# Patient Record
Sex: Female | Born: 1970 | Race: White | Hispanic: No | Smoking: Former smoker
Health system: Southern US, Community
[De-identification: ages and names within clinical notes are randomized; demographics above are authoritative.]

## PROBLEM LIST (undated history)

## (undated) DIAGNOSIS — J449 Chronic obstructive pulmonary disease, unspecified: Secondary | ICD-10-CM

## (undated) DIAGNOSIS — J439 Emphysema, unspecified: Secondary | ICD-10-CM

## (undated) HISTORY — PX: BLADDER SURGERY: SHX569

## (undated) HISTORY — PX: ABDOMINAL HYSTERECTOMY: SHX81

## (undated) HISTORY — PX: CHOLECYSTECTOMY: SHX55

---

## 2009-03-14 ENCOUNTER — Ambulatory Visit: Payer: Self-pay | Admitting: Internal Medicine

## 2012-07-29 ENCOUNTER — Ambulatory Visit: Payer: Self-pay

## 2012-08-17 ENCOUNTER — Emergency Department: Payer: Self-pay | Admitting: Emergency Medicine

## 2012-08-22 ENCOUNTER — Ambulatory Visit: Payer: Self-pay | Admitting: Unknown Physician Specialty

## 2012-12-16 ENCOUNTER — Ambulatory Visit: Payer: Self-pay | Admitting: Family Medicine

## 2012-12-16 LAB — URINALYSIS, COMPLETE
Bilirubin,UR: NEGATIVE
Glucose,UR: NEGATIVE mg/dL (ref 0–75)
Ph: 6 (ref 4.5–8.0)
Specific Gravity: 1.02 (ref 1.003–1.030)

## 2012-12-18 LAB — URINE CULTURE

## 2013-01-01 DIAGNOSIS — Z720 Tobacco use: Secondary | ICD-10-CM | POA: Insufficient documentation

## 2013-01-08 DIAGNOSIS — N8111 Cystocele, midline: Secondary | ICD-10-CM | POA: Insufficient documentation

## 2013-02-07 ENCOUNTER — Ambulatory Visit: Payer: Self-pay | Admitting: Family Medicine

## 2013-05-28 ENCOUNTER — Ambulatory Visit: Payer: Self-pay | Admitting: Family Medicine

## 2013-09-03 ENCOUNTER — Ambulatory Visit: Payer: Self-pay | Admitting: Anesthesiology

## 2013-09-18 ENCOUNTER — Ambulatory Visit: Payer: Self-pay | Admitting: Anesthesiology

## 2014-05-20 DIAGNOSIS — J449 Chronic obstructive pulmonary disease, unspecified: Secondary | ICD-10-CM | POA: Insufficient documentation

## 2014-05-29 ENCOUNTER — Ambulatory Visit: Payer: Self-pay | Admitting: Family Medicine

## 2014-07-04 ENCOUNTER — Ambulatory Visit: Payer: Self-pay | Admitting: Family Medicine

## 2014-08-09 NOTE — H&P (Signed)
PATIENT NAME:  Phyllis Edwards, [name] MR#:  [id number] DATE OF BIRTH:  01-Dec-1970  DATE OF ADMISSION:  09/03/2013  CHIEF COMPLAINT: Low back pain with radiation of the right posterior lateral leg.   PROCEDURE: None.   HISTORY OF PRESENT ILLNESS: Ms. Phyllis Edwards is a 44 year old female with a long-standing history of lower back pain with radiation into the right posterior lateral leg for the past 14 or 15 months.   She reportedly fell off a deck and now has a pain that is rated at a VAS of 10 maximum, 6 at best. Influenced by time of day, worse in the morning and worse at night, but any type of mobility seems to aggravate the pain with associated radiation down into the right posterolateral leg. She has pain that wakes her up at night, does not let her sleep, with some swelling she reports and associated numbness affecting the base of the right foot and occasionally the top of the right foot. The pain is described as sharp, shooting, stabbing, tender, and throbbing. She had a previous epidural steroid injection by DrMarland Kitchen..Marland Kitchen.Marland Kitchen.Marland Kitchen. without  getting any significant improvement and discontinued care with him. An MRI is available for the patient dated 08/22/2012 showing evidence of a right paracentral disk protrusion at L4-L5 with mild flattening of the right lateral recess and a small central disk protrusion at L5-S1.   PAST MEDICAL HISTORY: Significant for hysterectomy, cholecystectomy, bilateral tubal ligation, but otherwise she reports no other medical conditions.   REVIEW OF SYSTEMS:   CARDIAC: Negative. PULMONARY: Negative.  NEUROLOGIC: Negative.  MEDICATIONS: Diclofenac, cyclobenzaprine, and multivitamin.   ALLERGIES: FLAGYL WHICH YIELDS A RASH.   PHYSICAL EXAMINATION: VITAL SIGNS:  Reveal a blood pressure of 132/87, the VAS 6/10, temperature 98.8 with a heart rate of 92.  HEENT: Pupils equally round and reactive to light. Extraocular muscles intact.  HEART: Regular rate and rhythm.  LUNGS: Clear to  auscultation.  EXTREMITIES: Inspection of the low back reveals some pain with extension and left lateral rotation and right lateral rotation. She has a positive straight leg raise at approximately 45 degrees on the right side, negative on the left, and muscle tone and bulk seem to be good with intact strength.   ASSESSMENT:  1. L4-L5, L5-S1 degenerative disk disease with radicular symptoms in the L5 distribution, right side.  2. Lumbar facet syndrome.  3. Myofascial low back pain.   PLAN: I had a long discussion with the patient regarding her care and I would like to identify the level that Dr. Yves Dillhasnis  did perform for her epidural steroid injection. I think it would be reasonable to proceed with a repeat epidural injection to see if she gains some relief with changing this up slightly. Also, she may be a candidate for a facet injection. I will have her continue with her physical therapy regimen with efforts at stretching exercises described to her today and continue current medication management with return to clinic at next available date for.    ____________________________ Currie ParisJames G. Pernell DupreAdams, MD jga:dd D: 09/04/2013 16:25:22 ET T: 09/04/2013 17:57:24 ET JOB#: [id number]  cc: Currie ParisJames G. Pernell DupreAdams, MD, <Dictator> Courtney ParisElizabeth B. White, FNP Unknown CC   Phyllis EdwardsJAMES G ADAMS MD ELECTRONICALLY SIGNED 09/04/2013 20:31

## 2014-09-23 IMAGING — CR SACRUM AND COCCYX - 2+ VIEW
1 series · 4 of 4 positions shown · non-contrast
Comparison: none

REASON FOR EXAM: fall w/multiple contiusions
COMMENTS:

PROCEDURE:     MDR - MDR SACRUM AND COCCYX  - July 29, 2012 [DATE]
RESULT:     Comparison: None.

[Series 1: ap · 0.17mm/px · 4 of 4 slices shown]
[im 1/4]
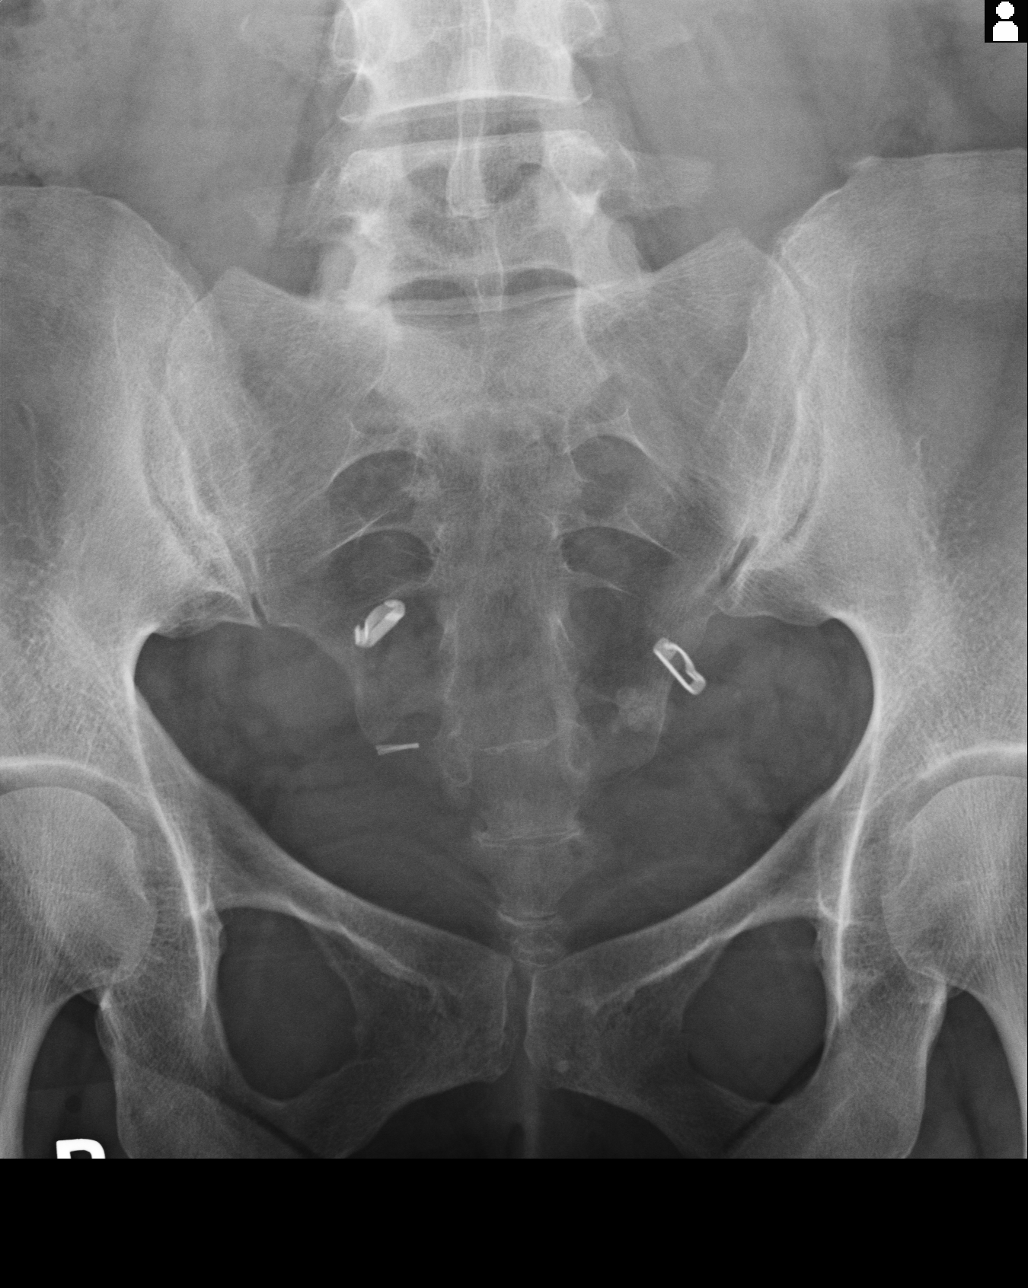
[im 2/4]
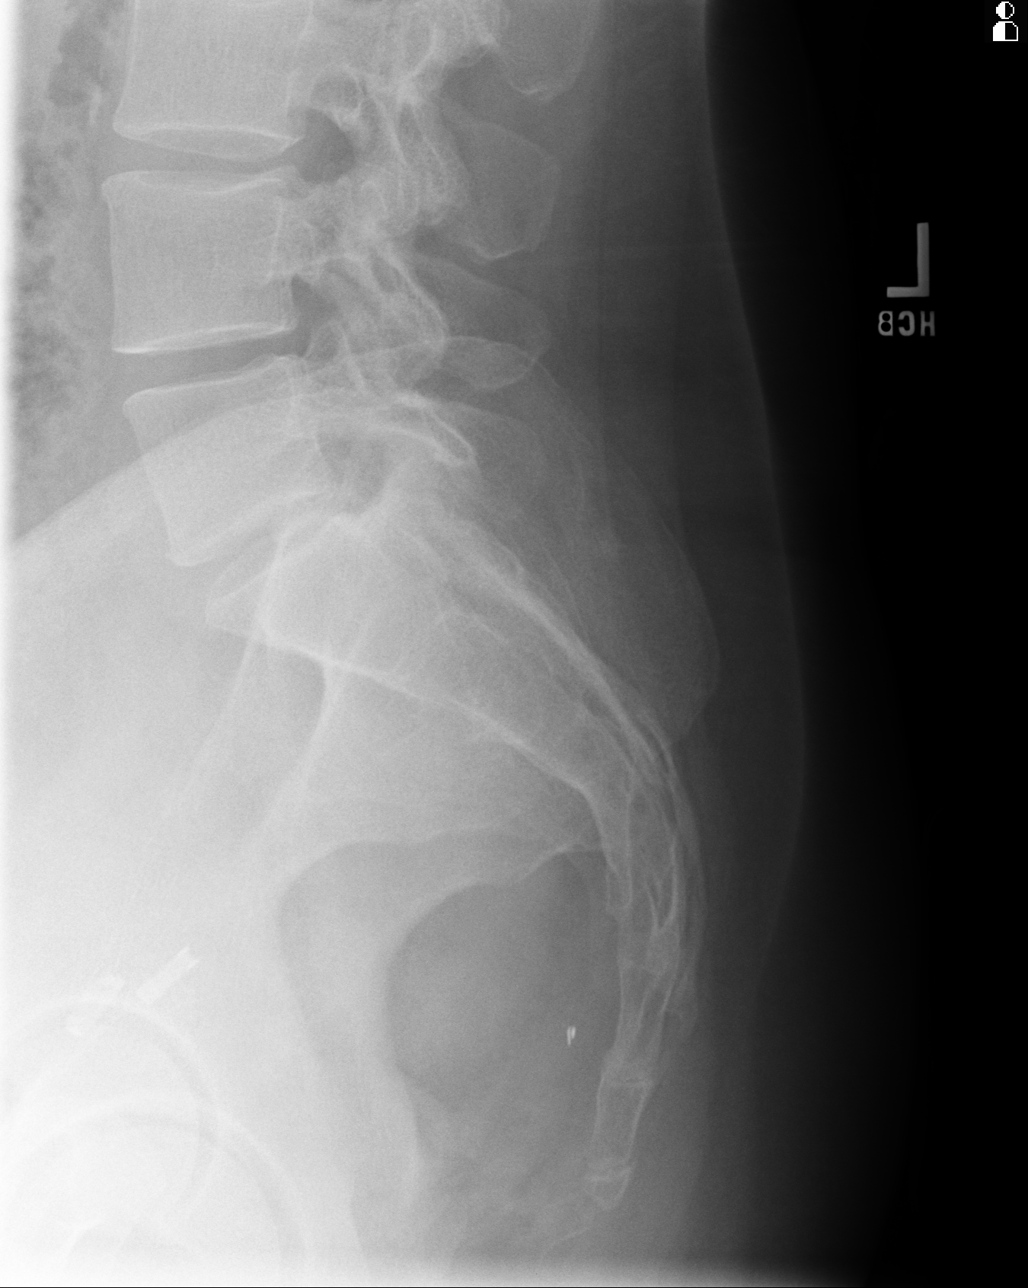
[im 3/4]
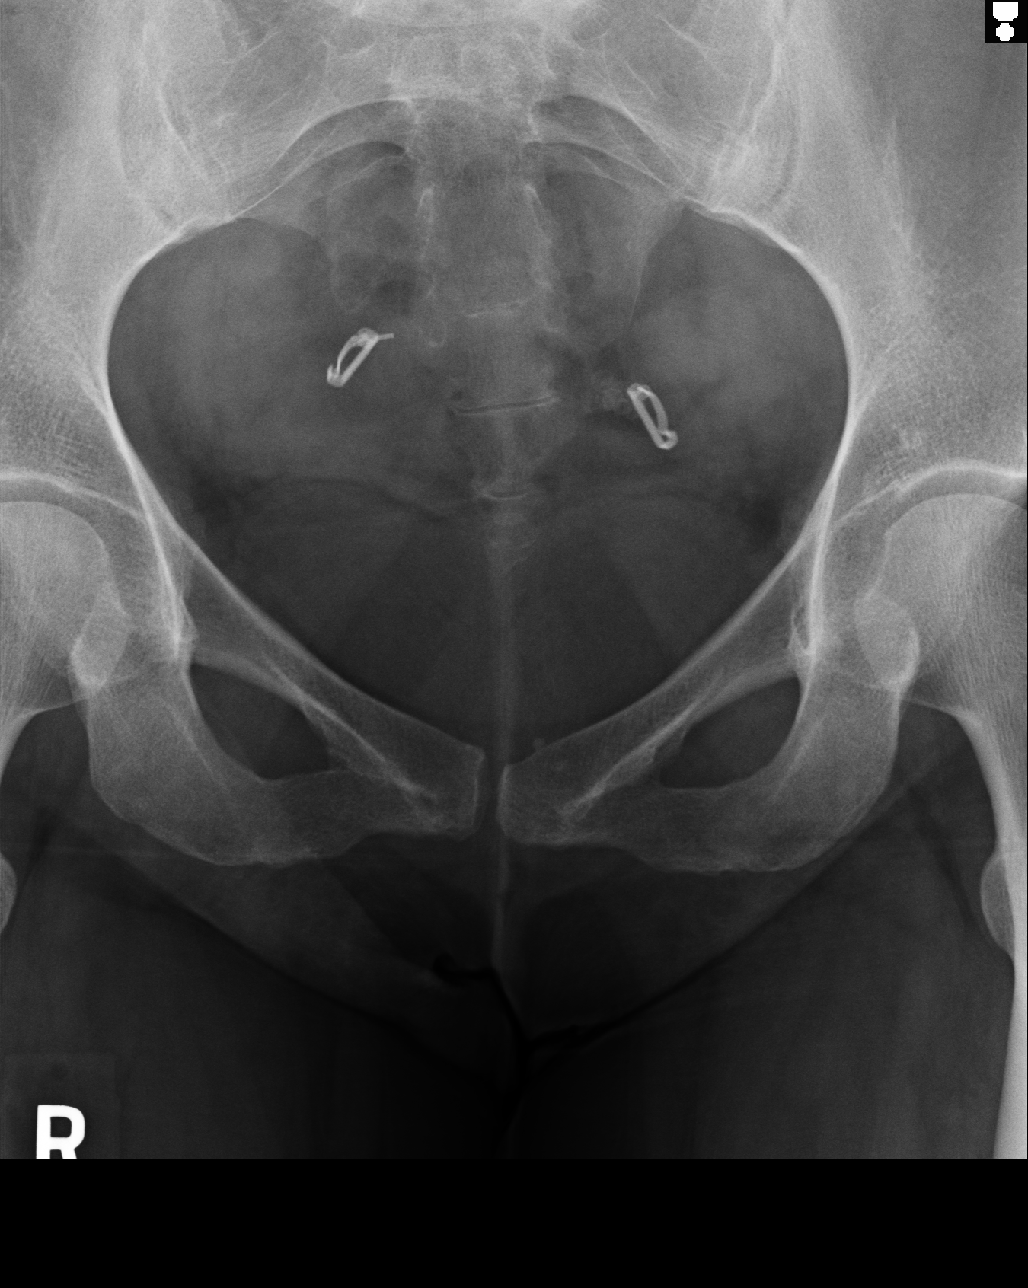
[im 4/4]
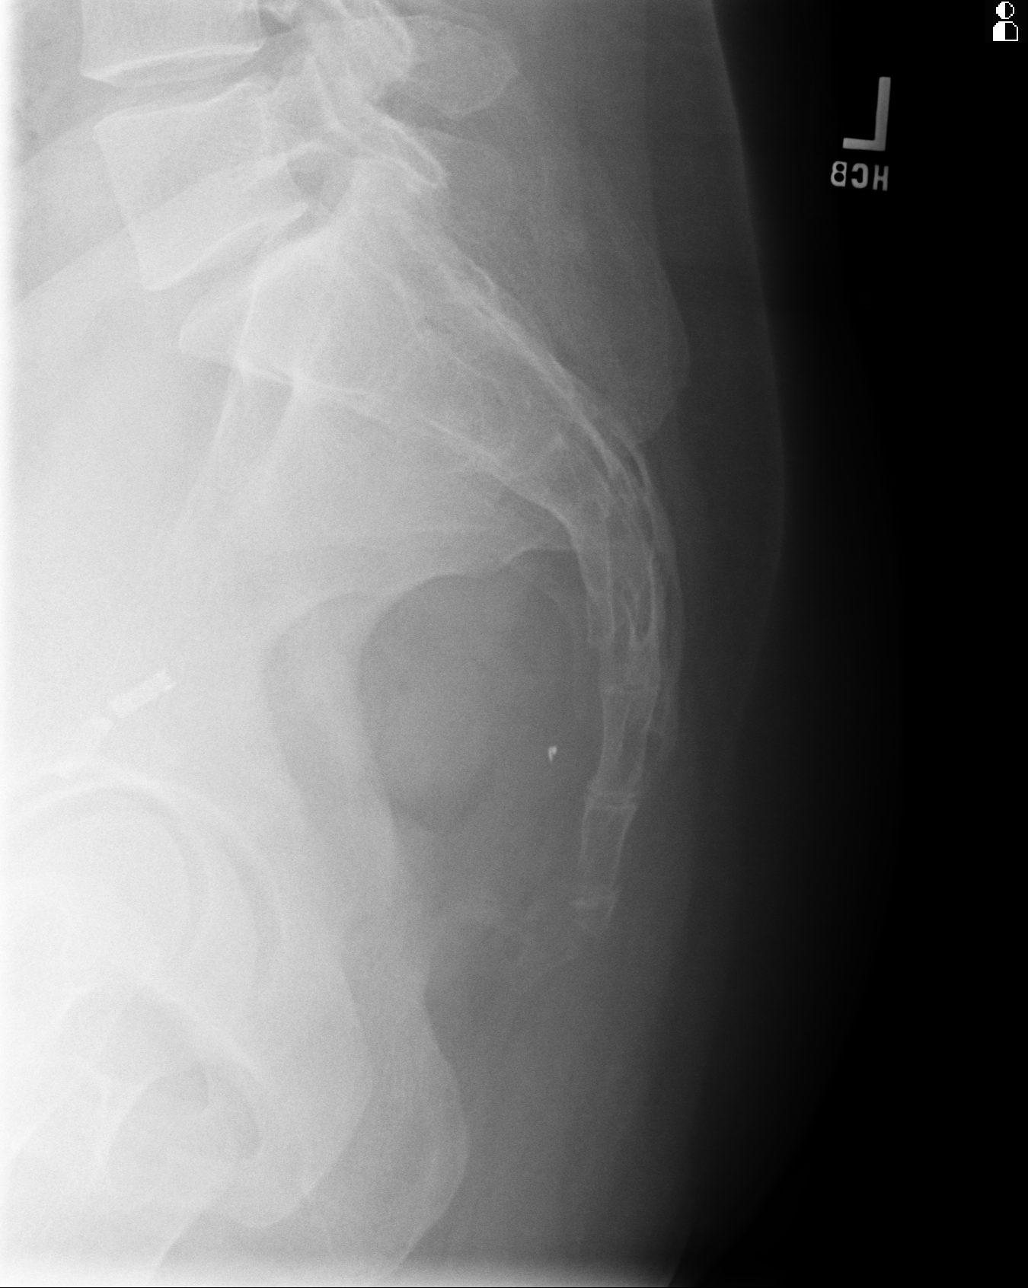

[4 of 4 positions shown; findings below may reference images not displayed]

FINDINGS: No displaced fracture seen.  Tubal ligation clips are present.
IMPRESSION: No displaced fracture seen.

[REDACTED]

## 2014-09-23 IMAGING — CR PELVIS - 1-2 VIEW
1 series · 1 of 1 positions shown · non-contrast
Comparison: none

REASON FOR EXAM: fall w/multiple contusions
COMMENTS:

PROCEDURE:     MDR - MDR PELVIS AP ONLY  - July 29, 2012 [DATE]
RESULT:     Comparison: None.

[ap]
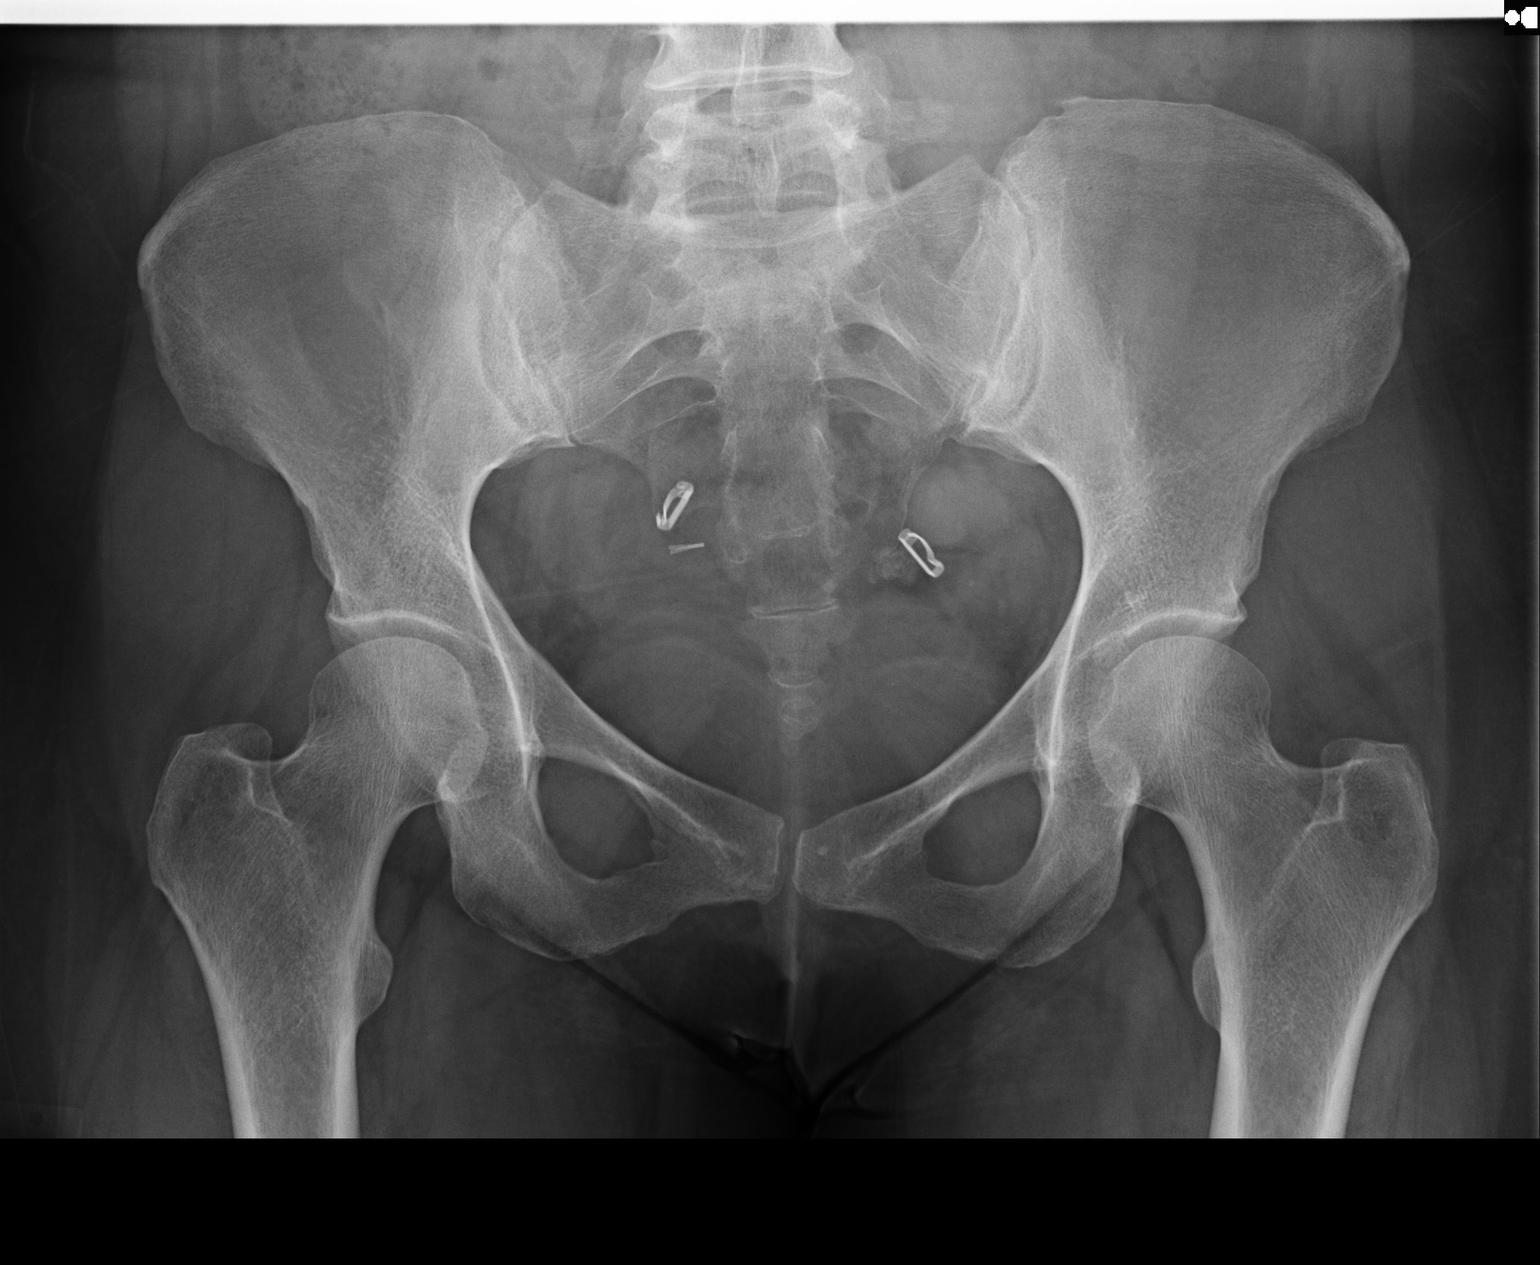

[1 of 1 positions shown; findings below may reference images not displayed]

FINDINGS: No acute fracture. Hip joint spaces are maintained. Tubal ligation clips are
present.
IMPRESSION: No acute fracture.

[REDACTED]

## 2014-09-23 IMAGING — CR CERVICAL SPINE - COMPLETE 4+ VIEW
1 series · 5 of 5 positions shown · non-contrast
Comparison: none

REASON FOR EXAM: fall w/multiple contusions
COMMENTS:

PROCEDURE:     MDR - MDR CERVICAL SPINE COMPLETE  - July 29, 2012 [DATE]
RESULT:     Comparison: None.

[Series 1: lat · 0.17mm/px · 5 of 5 slices shown]
[im 1/5]
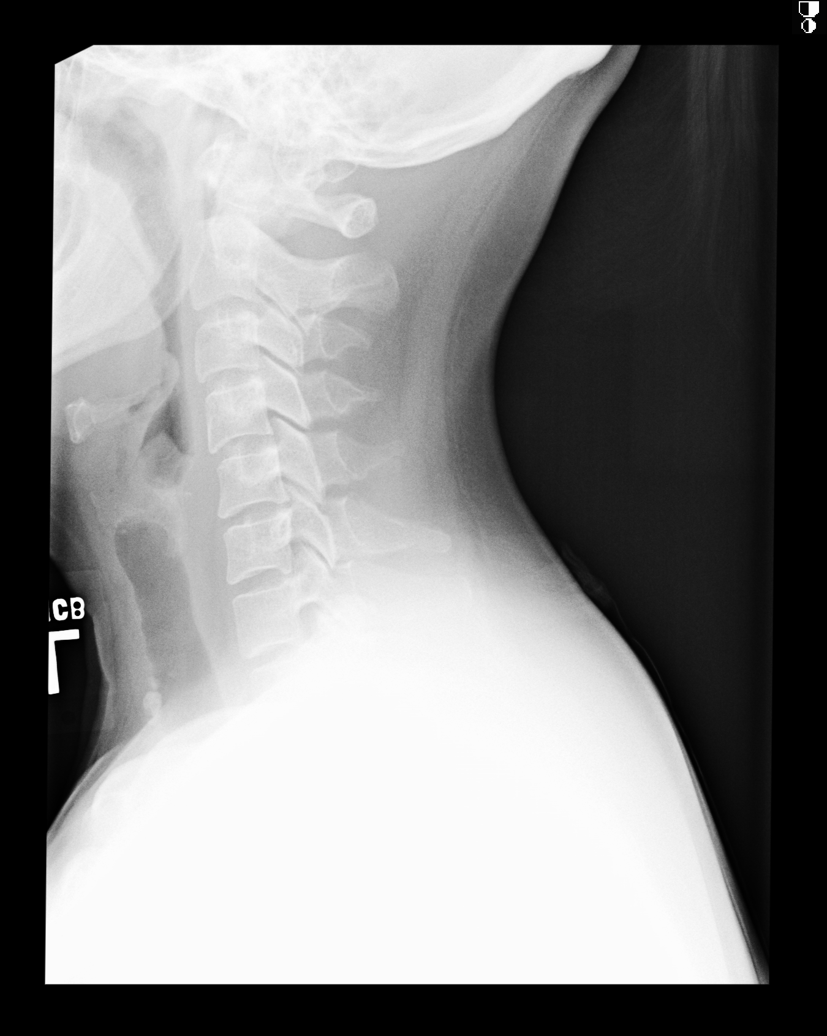
[im 2/5]
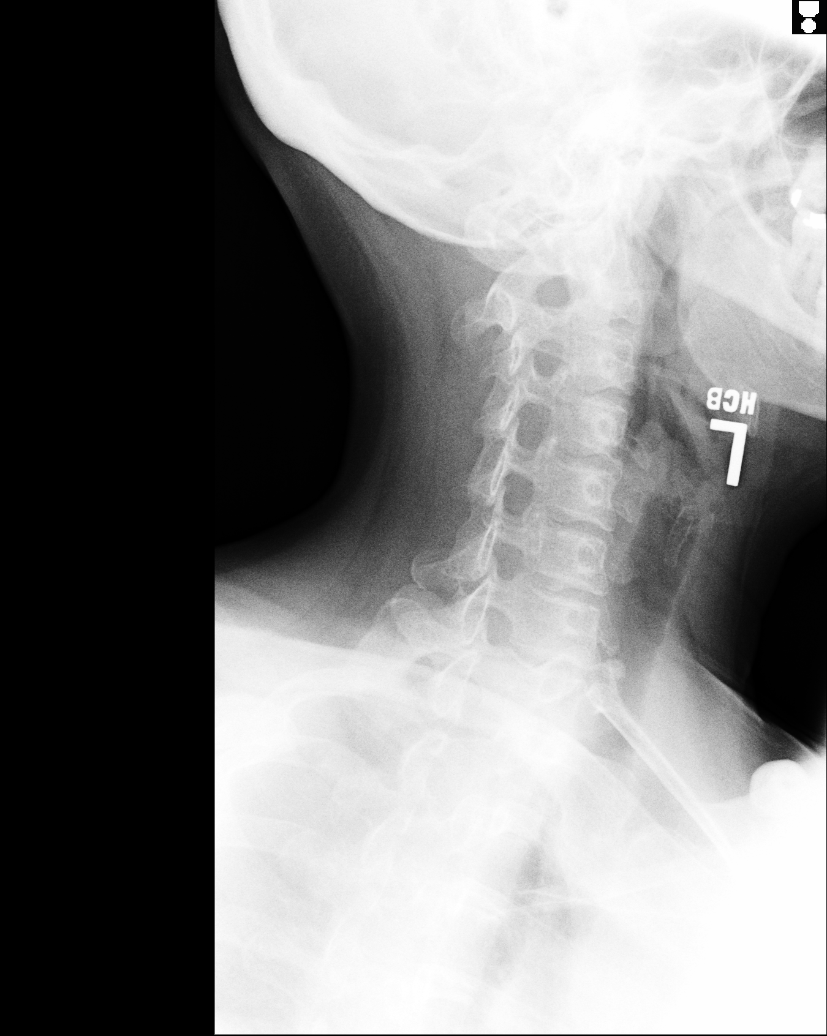
[im 3/5]
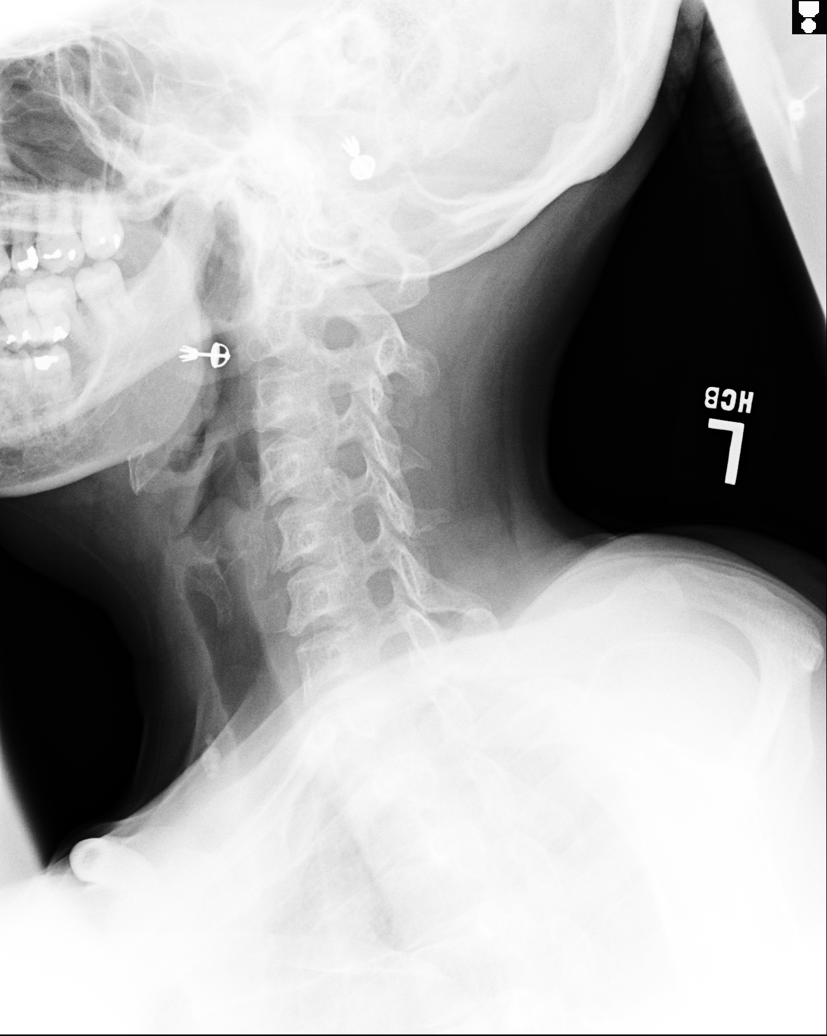
[im 4/5]
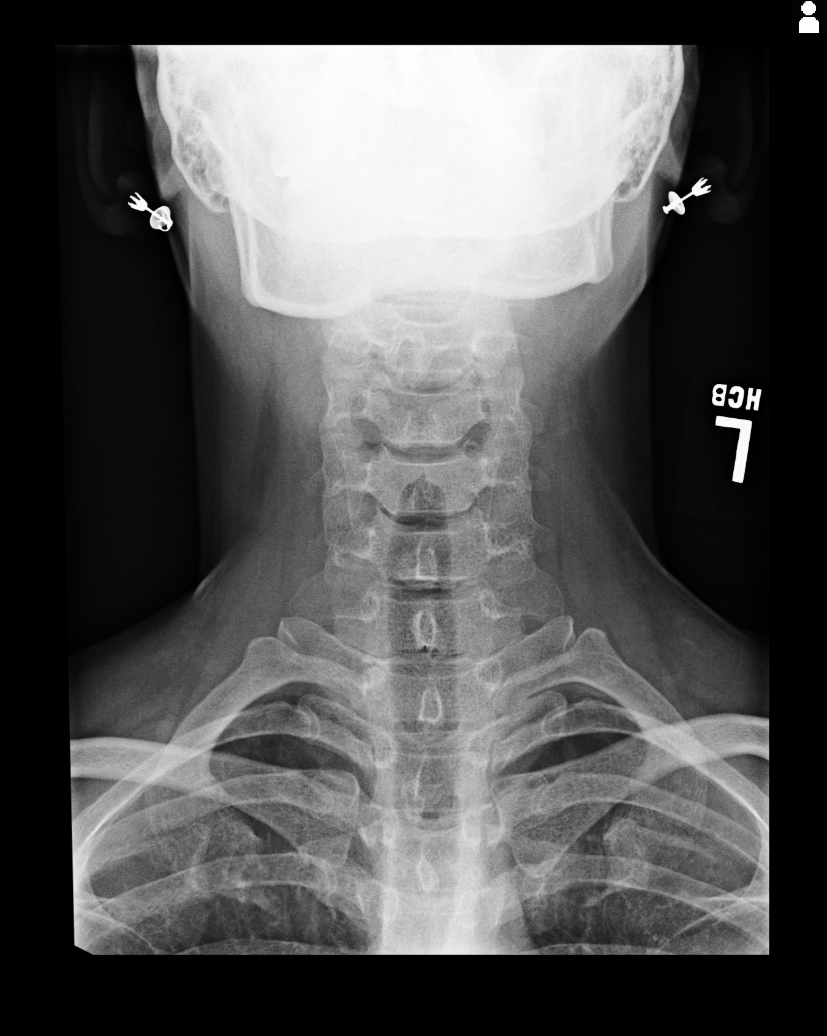
[im 5/5]
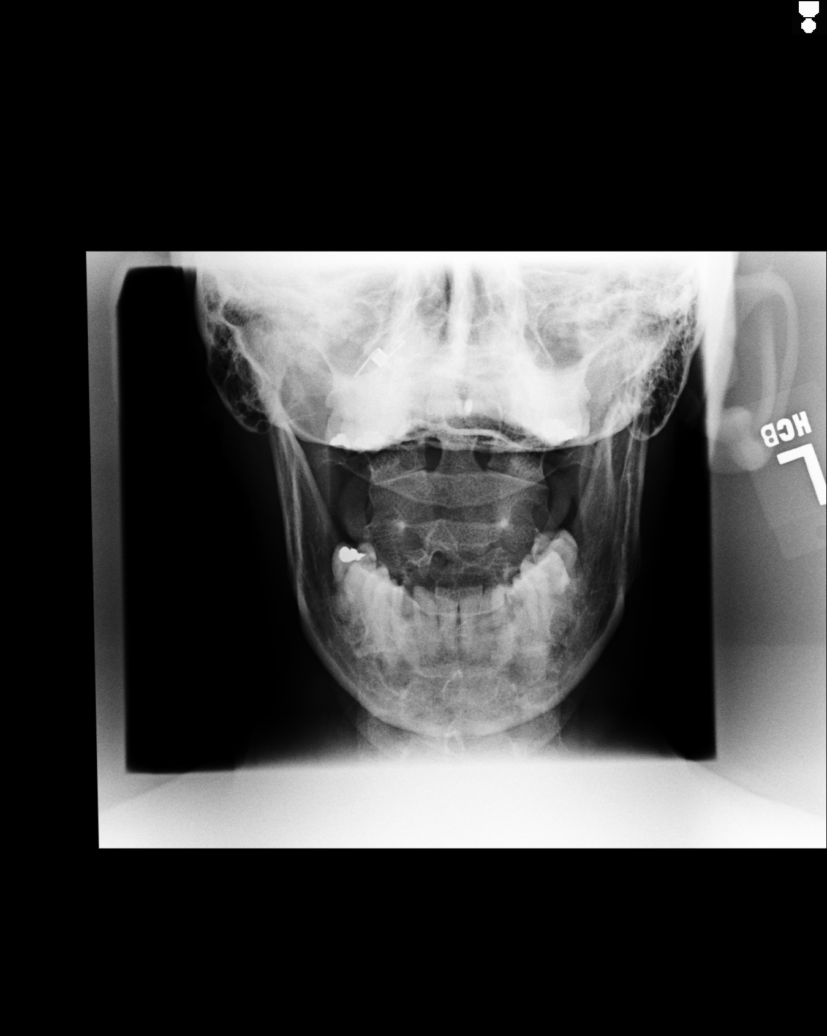

[5 of 5 positions shown; findings below may reference images not displayed]

FINDINGS: The cervical spine is imaged from the skull base through C7. There is slight
reversal of the normal cervical lordosis, which is nonspecific. Vertebral
body heights are maintained. There is normal alignment. Prevertebral soft
tissues are within normal limits.
IMPRESSION: No cervical spine fracture identified. If there is continued clinical
concern for occult cervical spine fracture, further evaluation with CT would
be recommended.

[REDACTED]

## 2014-09-23 IMAGING — CR DG SHOULDER 3+V*R*
1 series · 3 of 3 positions shown · non-contrast
Comparison: none

REASON FOR EXAM: fall w/multiple contusions
COMMENTS:

PROCEDURE:     MDR - MDR SHOULDER RIGHT COMPLETE  - July 29, 2012 [DATE]
RESULT:     Comparison: None.

[Series 1: internal rotate · 0.17mm/px · 3 of 3 slices shown]
[im 1/3]
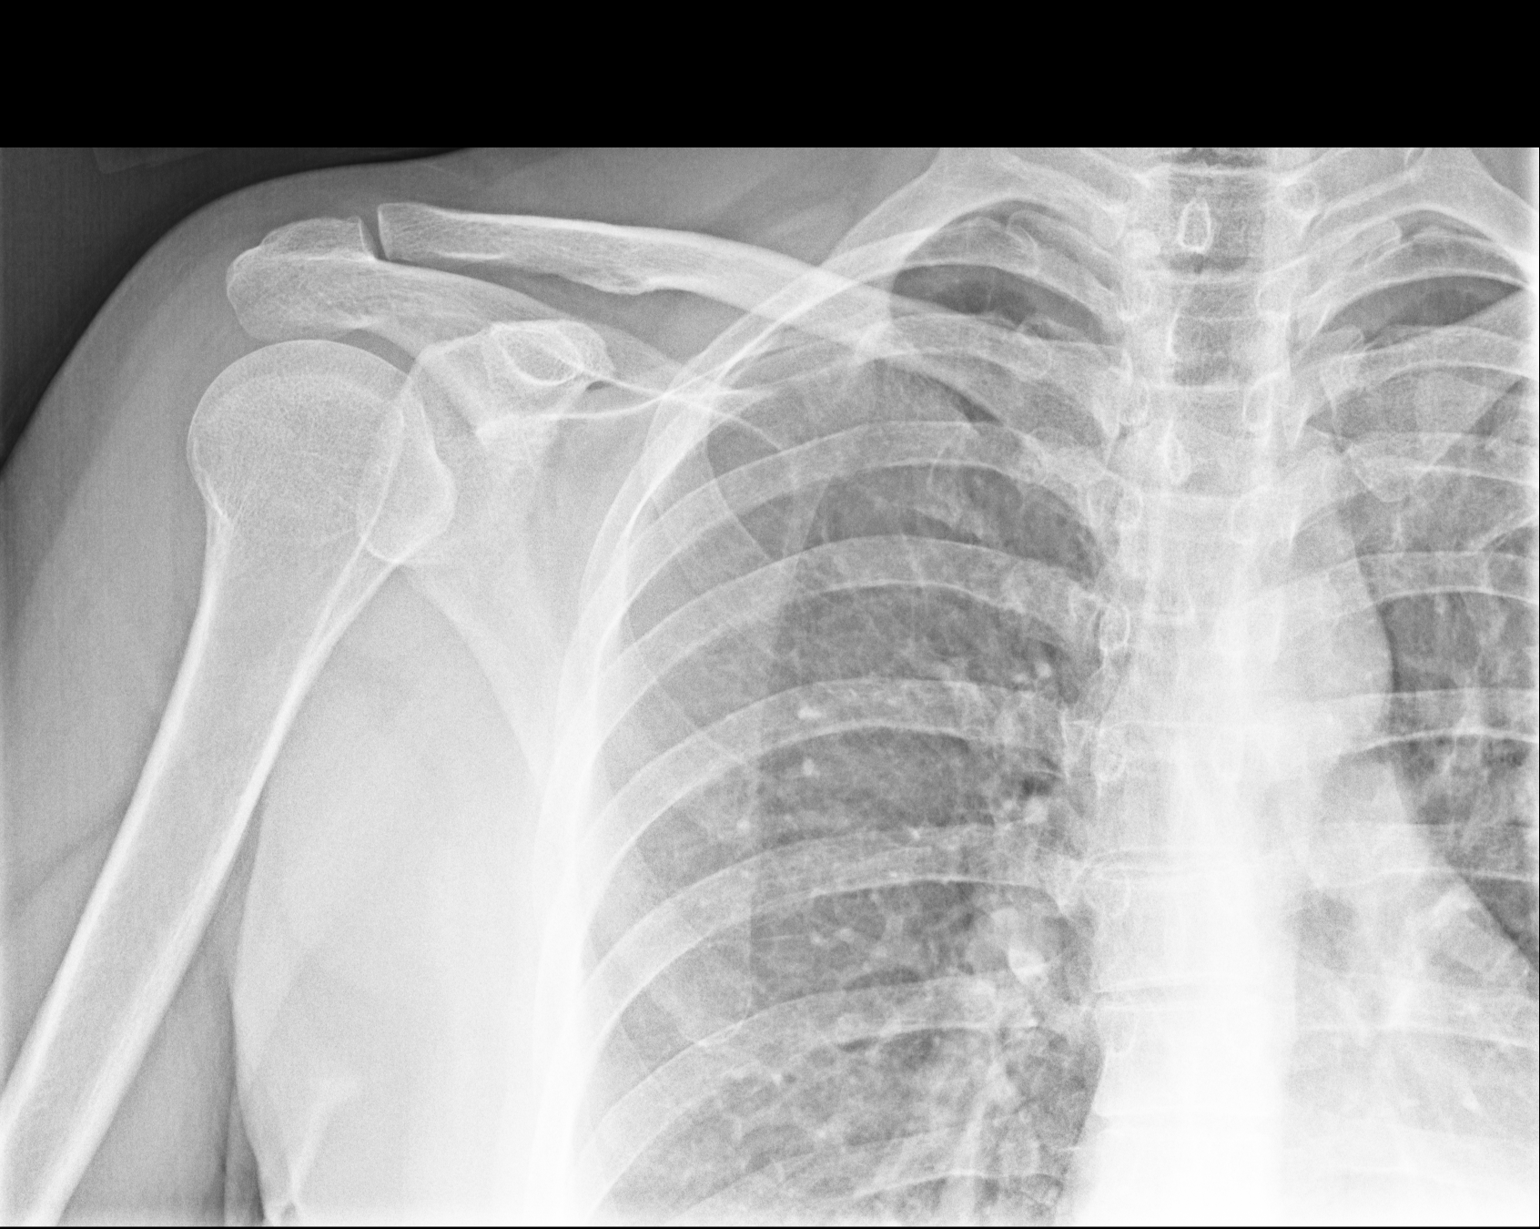
[im 2/3]
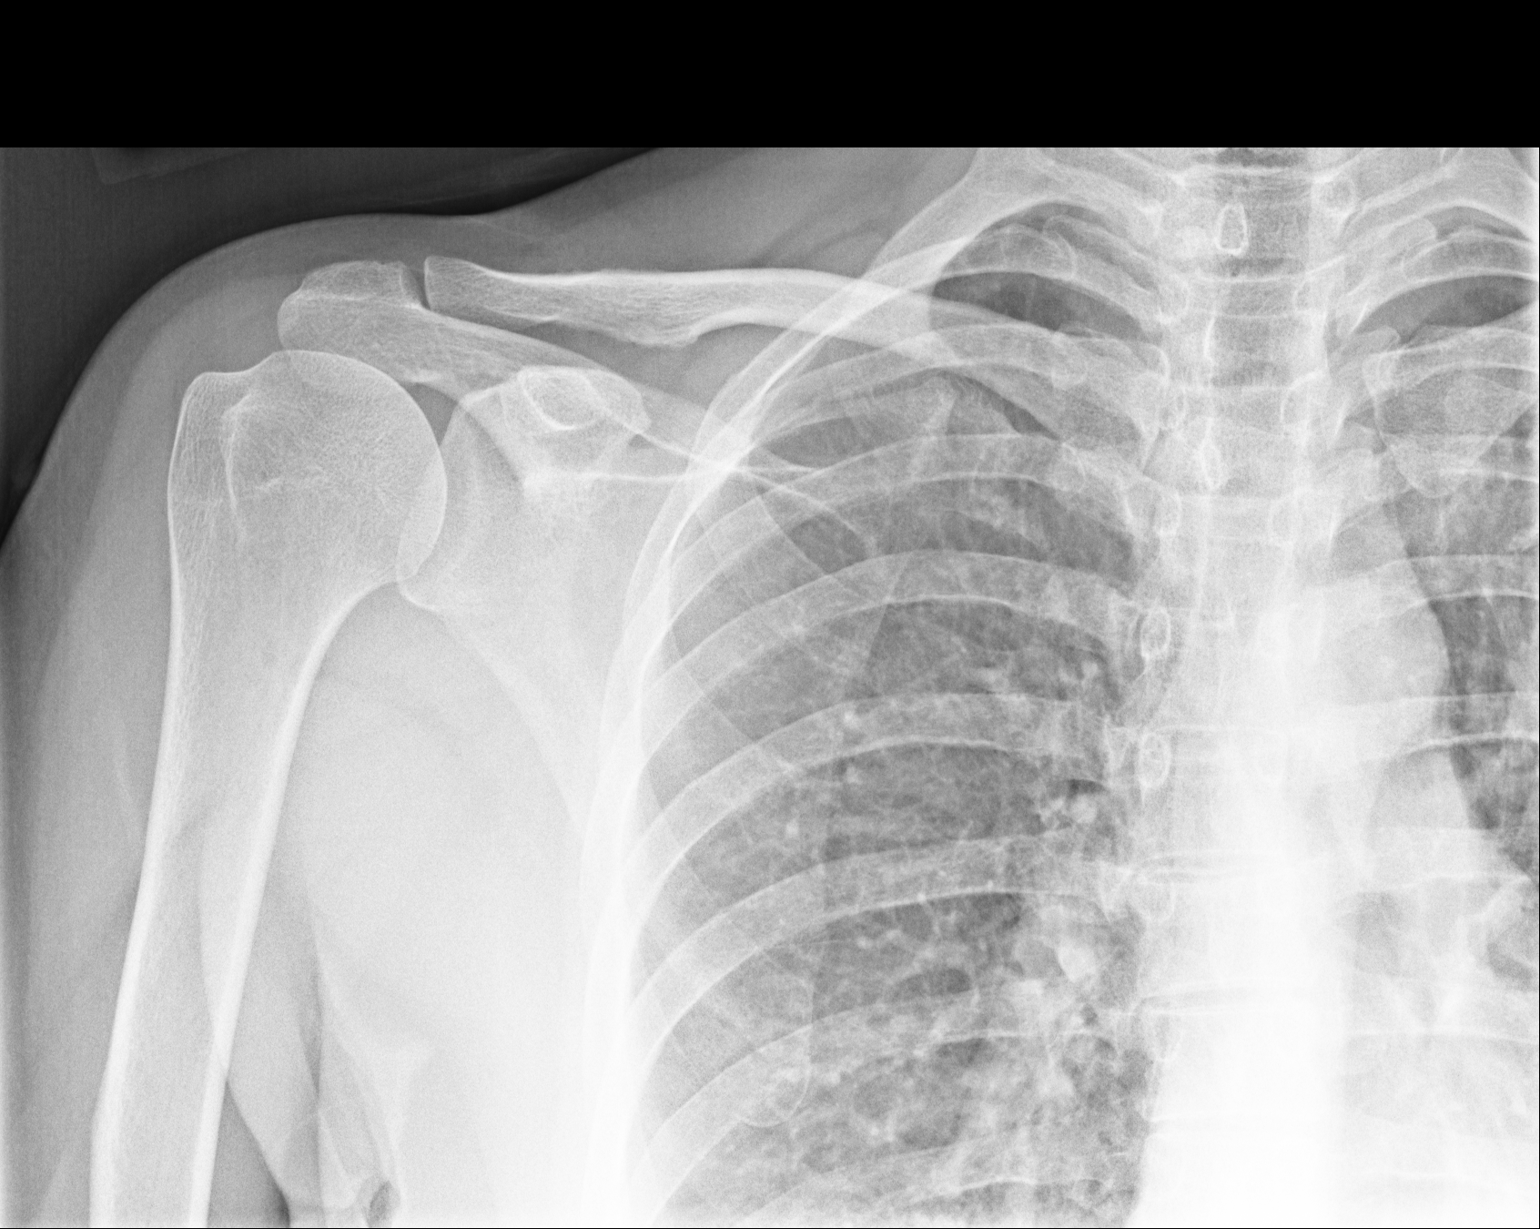
[im 3/3]
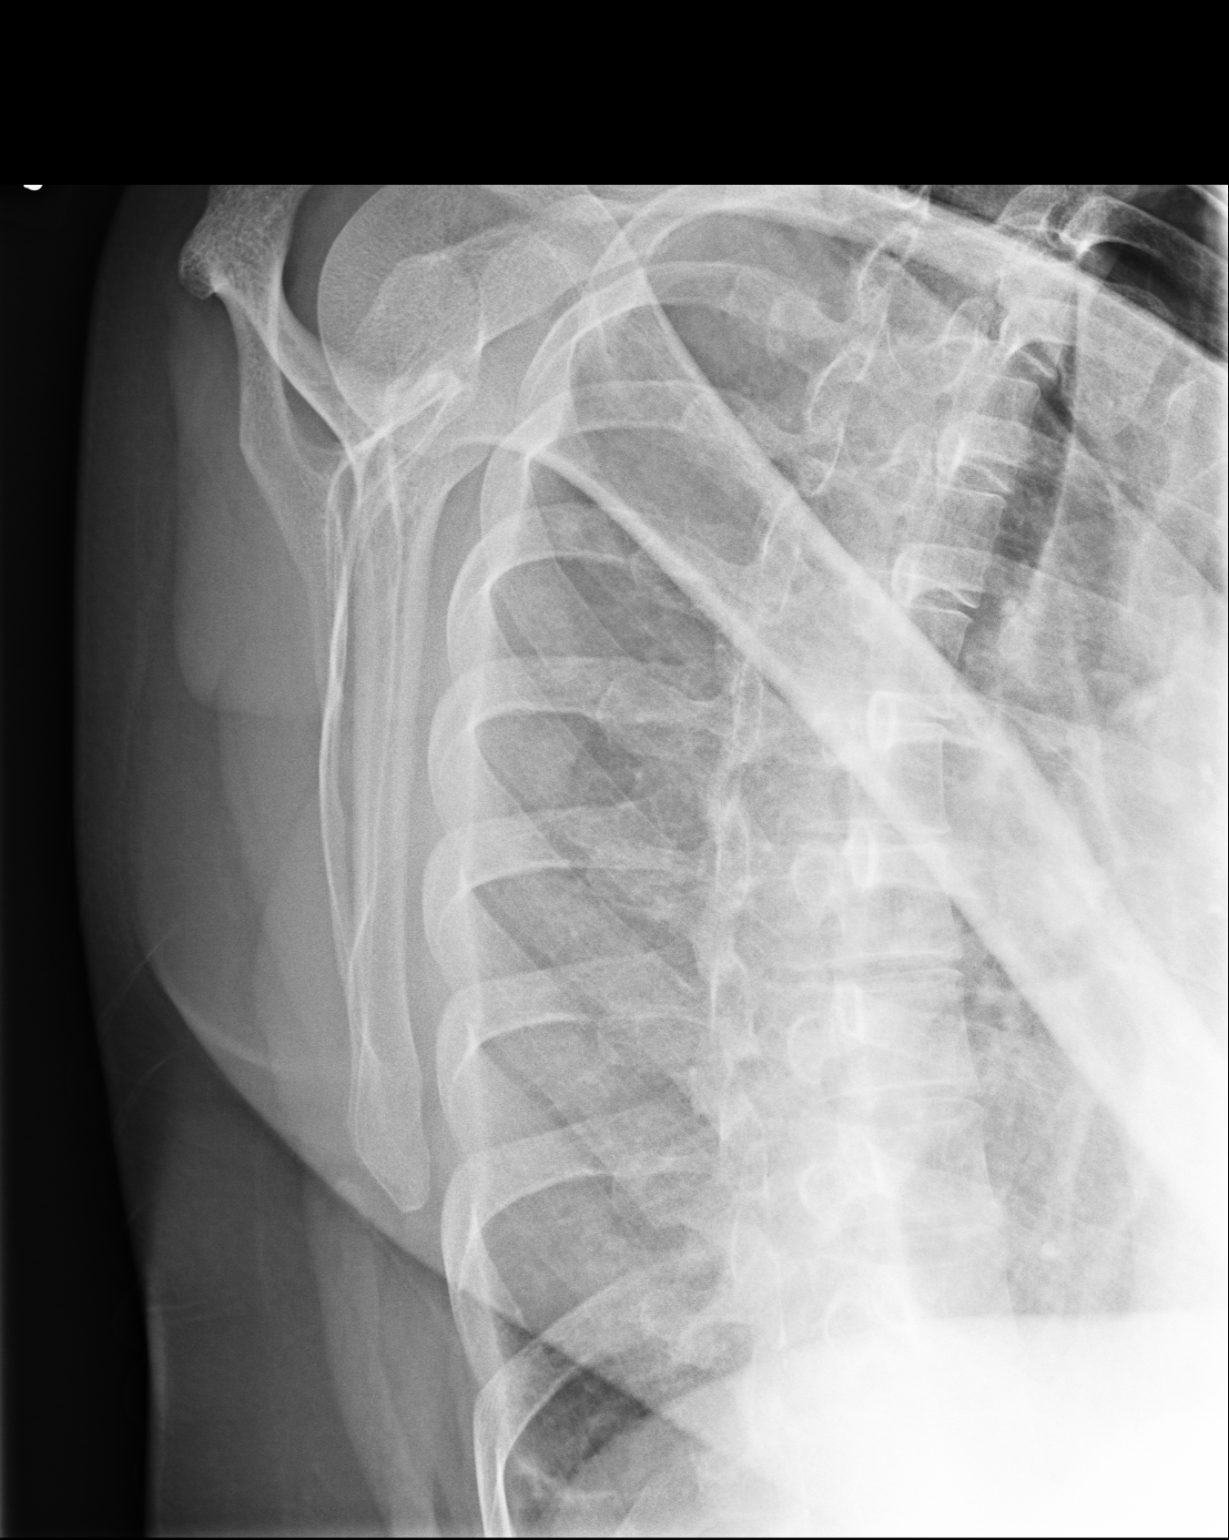

[3 of 3 positions shown; findings below may reference images not displayed]

FINDINGS: No acute fracture or dislocation. The acromioclavicular joint is
unremarkable.
IMPRESSION: No acute fracture or dislocation.

[REDACTED]

## 2015-01-09 ENCOUNTER — Ambulatory Visit
Admission: EM | Admit: 2015-01-09 | Discharge: 2015-01-09 | Disposition: A | Discharge status: Home / Self Care | Payer: Medicaid Other | Attending: Internal Medicine | Admitting: Internal Medicine

## 2015-01-09 ENCOUNTER — Ambulatory Visit: Payer: Medicaid Other

## 2015-01-09 ENCOUNTER — Encounter: Payer: Self-pay | Admitting: Emergency Medicine

## 2015-01-09 DIAGNOSIS — S9032XA Contusion of left foot, initial encounter: Secondary | ICD-10-CM | POA: Diagnosis not present

## 2015-01-09 DIAGNOSIS — F17200 Nicotine dependence, unspecified, uncomplicated: Secondary | ICD-10-CM | POA: Insufficient documentation

## 2015-01-09 DIAGNOSIS — M25572 Pain in left ankle and joints of left foot: Secondary | ICD-10-CM | POA: Diagnosis present

## 2015-01-09 DIAGNOSIS — X58XXXA Exposure to other specified factors, initial encounter: Secondary | ICD-10-CM | POA: Insufficient documentation

## 2015-01-09 MED ORDER — IBUPROFEN 600 MG PO TABS: 600.0000 mg | ORAL_TABLET | Freq: Three times a day (TID) | ORAL | Status: DC | PRN

## 2015-01-09 MED ORDER — TRAMADOL HCL 50 MG PO TABS: 50.0000 mg | ORAL_TABLET | Freq: Three times a day (TID) | ORAL | Status: DC | PRN

## 2015-01-09 NOTE — ED Notes (Signed)
Pt with left foot pain after dropping a pot on foot

## 2015-01-09 NOTE — Discharge Instructions (Signed)
Take medication as prescribed. Apply ice and elevate. Avoid strenuous activity. Use post operative shoe as long as pain continues.   Follow-up with her primary care physician this week as needed. Return to urgent care for new or worsening concerns.  Contusion A contusion is a deep bruise. Contusions are the result of an injury that caused bleeding under the skin. The contusion may turn blue, purple, or yellow. Minor injuries will give you a painless contusion, but more severe contusions may stay painful and swollen for a few weeks.  CAUSES  A contusion is usually caused by a blow, trauma, or direct force to an area of the body. SYMPTOMS   Swelling and redness of the injured area.  Bruising of the injured area.  Tenderness and soreness of the injured area.  Pain. DIAGNOSIS  The diagnosis can be made by taking a history and physical exam. An X-ray, CT scan, or MRI may be needed to determine if there were any associated injuries, such as fractures. TREATMENT  Specific treatment will depend on what area of the body was injured. In general, the best treatment for a contusion is resting, icing, elevating, and applying cold compresses to the injured area. Over-the-counter medicines may also be recommended for pain control. Ask your caregiver what the best treatment is for your contusion. HOME CARE INSTRUCTIONS   Put ice on the injured area.  Put ice in a plastic bag.  Place a towel between your skin and the bag.  Leave the ice on for 15-20 minutes, 3-4 times a day, or as directed by your health care provider.  Only take over-the-counter or prescription medicines for pain, discomfort, or fever as directed by your caregiver. Your caregiver may recommend avoiding anti-inflammatory medicines (aspirin, ibuprofen, and naproxen) for 48 hours because these medicines may increase bruising.  Rest the injured area.  If possible, elevate the injured area to reduce swelling. SEEK IMMEDIATE MEDICAL  CARE IF:   You have increased bruising or swelling.  You have pain that is getting worse.  Your swelling or pain is not relieved with medicines. MAKE SURE YOU:   Understand these instructions.  Will watch your condition.  Will get help right away if you are not doing well or get worse. Document Released: 01/12/2005 Document Revised: 04/09/2013 Document Reviewed: 02/07/2011 [facility] Patient Information 2015 [place], [place]. This information is not intended to replace advice given to you by your health care provider. Make sure you discuss any questions you have with your health care provider.

## 2015-01-09 NOTE — ED Provider Notes (Signed)
[facility] Emergency Department Provider Note  ____________________________________________  Time seen: Approximately 7:04 PM  I have reviewed the triage vital signs and the nursing notes.   HISTORY  Chief Complaint Foot Pain   HPI Phyllis Edwards is a 44 y.o. female presents with complaints of left foot pain 2 days. Patient reports 2 days ago she was at home and distracted by her daughter and granddaughter and states accidentally dropped a pot of water on her left foot. Patient reports that she has had pain since. Reports continues ambulate but with pain and tends to walk on her heel. States pain is currently 5 out of 10 to the top of her foot. Denies numbness or tingling sensation. Reports some intermittent swelling. Denies fall. Denies head injury or loss consciousness. Denies other pain or injury. Denies pain radiation.   History reviewed. No pertinent past medical history.  Hysterectomy Cholecystectomy LEEP x 2 Back pain  There are no active problems to display for this patient.   History reviewed. No pertinent past surgical history.  Current Outpatient Rx  Name  Route  Sig  Dispense  Refill  . cephALEXin (KEFLEX) 500 MG capsule   Oral   Take 500 mg by mouth 4 (four) times daily.         Currently for abscessed tooth per patient   Allergies Flagyl  History reviewed. No pertinent family history.  Social History Social History  Substance Use Topics  . Smoking status: Current Some Day Smoker  . Smokeless tobacco: None  . Alcohol Use: No    Review of Systems Constitutional: No fever/chills Eyes: No visual changes. ENT: No sore throat. Cardiovascular: Denies chest pain. Respiratory: Denies shortness of breath. Gastrointestinal: No abdominal pain.  No nausea, no vomiting.  No diarrhea.  No constipation. Genitourinary: Negative for dysuria. Musculoskeletal: Negative for back pain. Positive for left foot pain as above.  Skin:  Negative for rash. Neurological: Negative for headaches, focal weakness or numbness.  10-point ROS otherwise negative.  ____________________________________________   PHYSICAL EXAM:  VITAL SIGNS: ED Triage Vitals  Enc Vitals Group     BP 01/09/15 1835 125/66 mmHg     Pulse Rate 01/09/15 1835 60     Resp 01/09/15 1835 18     Temp 01/09/15 1835 97.9 F (36.6 C)     Temp Source 01/09/15 1835 Tympanic     SpO2 01/09/15 1835 100 %     Weight 01/09/15 1835 127 lb (57.607 kg)     Height 01/09/15 1835 5' 5.5" (1.664 m)     Head Cir --      Peak Flow --      Pain Score 01/09/15 1838 7     Pain Loc --      Pain Edu? --      Excl. in GC? --     Constitutional: Alert and oriented. Well appearing and in no acute distress. Eyes: Conjunctivae are normal. PERRL. EOMI. Head: Atraumatic.  Nose: No congestion/rhinnorhea.  Mouth/Throat: Mucous membranes are moist.   Neck: No stridor.  No cervical spine tenderness to palpation. Cardiovascular: Normal rate, regular rhythm. Grossly normal heart sounds.  Good peripheral circulation. Respiratory: Normal respiratory effort.  No retractions. Lungs CTAB. Gastrointestinal: Soft and nontender. No distention. Normal Bowel sounds. No CVA tenderness. Musculoskeletal: No lower or upper extremity tenderness nor edema.  No joint effusions. Bilateral pedal pulses equal and easily palpated.  Except: left dorsal mid foot mild to mod TTP, mild ecchymosis, and minimal swelling, pedal  pulses equal and easily palpated bilateral, full ROM, no pain with plantar flexion, mild pain with dorsiflexion. Gait not tested due to pain.  Neurologic:  Normal speech and language. No gross focal neurologic deficits are appreciated.  Skin:  Skin is warm, dry and intact. No rash noted. Psychiatric: Mood and affect are normal. Speech and behavior are normal.  ____________________________________________   LABS (all labs ordered are listed, but only abnormal results are  displayed)  Labs Reviewed - No data to display  RADIOLOGY LEFT FOOT - COMPLETE 3+ VIEW  COMPARISON: 07/29/2012  FINDINGS: There is no evidence of fracture or dislocation. There is no evidence of arthropathy or other focal bone abnormality. Soft tissues are unremarkable.  IMPRESSION: Negative.   Electronically Signed By: Charlett Nose M.D. On: 01/09/2015 19:07  I, Renford Dills, personally viewed and evaluated these images (plain radiographs) as part of my medical decision making.    ____________________________________________   PROCEDURES  Procedure(s) performed:    Left post operative shoe applied. Patient denies need for crutches.   INITIAL IMPRESSION / ASSESSMENT AND PLAN / ED COURSE  Pertinent labs & imaging results that were available during my care of the patient were reviewed by me and considered in my medical decision making (see chart for details).  Very well-appearing patient. No acute distress. Presents for left dorsal foot pain post dropping a pot of water (room temperature water) on left dorsal foot. Continues a very well. Denies other pain or injury. Will evaluate x-ray.  Left foot x-ray negative. Post operative shoe, rest, and elevation. Patient denies  Need for crutches. Prn ibuprofen and tramadol. Discussed follow up with Primary care physician this week. Discussed follow up and return parameters including no resolution or any worsening concerns. Patient verbalized understanding and agreed to plan.   ____________________________________________   FINAL CLINICAL IMPRESSION(S) / ED DIAGNOSES  Final diagnoses:  Foot contusion, left, initial encounter       Renford Dills, NP 01/09/15 1918

## 2015-03-04 ENCOUNTER — Ambulatory Visit
Admission: EM | Admit: 2015-03-04 | Discharge: 2015-03-04 | Disposition: A | Discharge status: Home / Self Care | Payer: Medicaid Other | Attending: Family Medicine | Admitting: Family Medicine

## 2015-03-04 DIAGNOSIS — M545 Low back pain, unspecified: Secondary | ICD-10-CM

## 2015-03-04 MED ORDER — OXYCODONE HCL 15 MG PO TABS: 15.0000 mg | ORAL_TABLET | Freq: Three times a day (TID) | ORAL | Status: DC | PRN

## 2015-03-04 MED ORDER — CYCLOBENZAPRINE HCL 10 MG PO TABS: 10.0000 mg | ORAL_TABLET | Freq: Every day | ORAL | Status: DC

## 2015-03-04 NOTE — ED Provider Notes (Signed)
CSN: [id number]     Arrival date & time 03/04/15  1829 History   First MD Initiated Contact with Patient 03/04/15 1954     Chief Complaint  Patient presents with  . Back Pain   (Consider location/radiation/quality/duration/timing/severity/associated sxs/prior Treatment) Patient is a 44 y.o. female presenting with back pain. The history is provided by the patient.  Back Pain Location:  Lumbar spine Quality:  Aching Radiates to:  R posterior upper leg Pain severity:  Severe Pain is:  Same all the time Onset quality:  Sudden (patient reports a h/o chronic low back pain, disc disease; was being prescribed flexreril and oxycodone by PCP, but reports had insurance change and hasn't seen a PCP in a long time) Timing:  Constant Worsened by:  Movement Ineffective treatments:  None tried Associated symptoms: no bladder incontinence, no bowel incontinence, no fever, no numbness, no paresthesias, no perianal numbness, no tingling and no weight loss     No past medical history on file. No past surgical history on file. No family history on file. Social History  Substance Use Topics  . Smoking status: Current Every Day Smoker -- 0.50 packs/day  . Smokeless tobacco: None  . Alcohol Use: Yes     Comment: occasional   OB History    No data available     Review of Systems  Constitutional: Negative for fever and weight loss.  Gastrointestinal: Negative for bowel incontinence.  Genitourinary: Negative for bladder incontinence.  Musculoskeletal: Positive for back pain.  Neurological: Negative for tingling, numbness and paresthesias.    Allergies  Flagyl  Home Medications   Prior to Admission medications   Medication Sig Start Date End Date Taking? Authorizing Provider  cephALEXin (KEFLEX) 500 MG capsule Take 500 mg by mouth 4 (four) times daily.    Historical Provider, MD  cyclobenzaprine (FLEXERIL) 10 MG tablet Take 1 tablet (10 mg total) by mouth at bedtime. 03/04/15   Payton Mccallum, MD  ibuprofen (ADVIL,MOTRIN) 600 MG tablet Take 1 tablet (600 mg total) by mouth every 8 (eight) hours as needed for mild pain or moderate pain. 01/09/15   Renford Dills, NP  oxyCODONE (ROXICODONE) 15 MG immediate release tablet Take 1 tablet (15 mg total) by mouth every 8 (eight) hours as needed for pain. 03/04/15   Payton Mccallum, MD  traMADol (ULTRAM) 50 MG tablet Take 1 tablet (50 mg total) by mouth every 8 (eight) hours as needed (Do not drive or operate machinery while taking as can cause drowsiness.). 01/09/15   Renford Dills, NP   Meds Ordered and Administered this Visit  Medications - No data to display  BP 119/75 mmHg  Pulse 71  Temp(Src) 98 F (36.7 C) (Oral)  Resp 16  Ht  (1.626 m)  Wt 121 lb (54.885 kg)  BMI 20.76 kg/m2  SpO2 100% No data found.   Physical Exam  Constitutional: She appears well-developed and well-nourished. No distress.  Musculoskeletal: She exhibits no edema.       Lumbar back: She exhibits decreased range of motion, tenderness and spasm. She exhibits no bony tenderness, no swelling, no edema, no deformity, no laceration and normal pulse.  Tenderness to palpation over the lumbarsacral paraspinous muscles  Neurological: She is alert. She has normal reflexes. She displays normal reflexes. No cranial nerve deficit. She exhibits normal muscle tone. Coordination normal.  Skin: She is not diaphoretic.  Nursing note and vitals reviewed.   ED Course  Procedures (including critical care time)  Labs Review Labs  Reviewed - No data to display  Imaging Review No results found.   Visual Acuity Review  Right Eye Distance:   Left Eye Distance:   Bilateral Distance:    Right Eye Near:   Left Eye Near:    Bilateral Near:         MDM   1. Right-sided low back pain without sciatica      Discharge Medication List as of 03/04/2015  8:10 PM    START taking these medications   Details  cyclobenzaprine (FLEXERIL) 10 MG tablet Take 1  tablet (10 mg total) by mouth at bedtime., Starting 03/04/2015, Until Discontinued, Print    oxyCODONE (ROXICODONE) 15 MG immediate release tablet Take 1 tablet (15 mg total) by mouth every 8 (eight) hours as needed for pain., Starting 03/04/2015, Until Discontinued, Print      1. diagnosis reviewed with patient 2. rx as per orders above; reviewed possible side effects, interactions, risks and benefits  3. Recommend supportive treatment with heat, stretches  4. Follow-up with PCP for chronic medication and/or referral to pain specialist    Payton Mccallumrlando Beya Tipps, MD 03/04/15 2039

## 2015-03-04 NOTE — ED Notes (Signed)
Pt has had ongoing issues with her discs (L3- L4) x 4 years. Pt reports her back "popped" this morning, which caused pt to fall. Pt is currently on Medicare, which is causing a struggle to find a primary. Pt is concerned because the pain is causing a lack of sleep and disrupts her normal activities.

## 2015-03-10 ENCOUNTER — Telehealth: Payer: Self-pay | Admitting: Emergency Medicine

## 2015-05-21 ENCOUNTER — Ambulatory Visit
Admission: EM | Admit: 2015-05-21 | Discharge: 2015-05-21 | Disposition: A | Discharge status: Home / Self Care | Payer: Medicaid Other | Attending: Family Medicine | Admitting: Family Medicine

## 2015-05-21 ENCOUNTER — Encounter: Payer: Self-pay | Admitting: Emergency Medicine

## 2015-05-21 DIAGNOSIS — F17210 Nicotine dependence, cigarettes, uncomplicated: Secondary | ICD-10-CM | POA: Insufficient documentation

## 2015-05-21 DIAGNOSIS — R519 Headache, unspecified: Secondary | ICD-10-CM

## 2015-05-21 DIAGNOSIS — J449 Chronic obstructive pulmonary disease, unspecified: Secondary | ICD-10-CM | POA: Insufficient documentation

## 2015-05-21 DIAGNOSIS — R51 Headache: Secondary | ICD-10-CM | POA: Insufficient documentation

## 2015-05-21 DIAGNOSIS — J069 Acute upper respiratory infection, unspecified: Secondary | ICD-10-CM | POA: Insufficient documentation

## 2015-05-21 DIAGNOSIS — J441 Chronic obstructive pulmonary disease with (acute) exacerbation: Secondary | ICD-10-CM

## 2015-05-21 HISTORY — DX: Chronic obstructive pulmonary disease, unspecified: J44.9

## 2015-05-21 LAB — RAPID INFLUENZA A&B ANTIGENS (ARMC ONLY): INFLUENZA B (ARMC): NOT DETECTED

## 2015-05-21 LAB — RAPID INFLUENZA A&B ANTIGENS: Influenza A (ARMC): NOT DETECTED

## 2015-05-21 MED ORDER — FLUTICASONE PROPIONATE 50 MCG/ACT NA SUSP: 2.0000 | Freq: Every day | NASAL | Status: DC

## 2015-05-21 MED ORDER — ALBUTEROL SULFATE (2.5 MG/3ML) 0.083% IN NEBU: 2.5000 mg | INHALATION_SOLUTION | Freq: Four times a day (QID) | RESPIRATORY_TRACT | Status: DC | PRN

## 2015-05-21 MED ORDER — ONDANSETRON 8 MG PO TBDP
8.0000 mg | ORAL_TABLET | Freq: Once | ORAL | Status: AC
  Administered 2015-05-21: 8 mg via ORAL

## 2015-05-21 MED ORDER — ALBUTEROL SULFATE HFA 108 (90 BASE) MCG/ACT IN AERS: 1.0000 | INHALATION_SPRAY | Freq: Four times a day (QID) | RESPIRATORY_TRACT | Status: DC | PRN

## 2015-05-21 MED ORDER — KETOROLAC TROMETHAMINE 60 MG/2ML IM SOLN
60.0000 mg | Freq: Once | INTRAMUSCULAR | Status: AC
  Administered 2015-05-21: 60 mg via INTRAMUSCULAR

## 2015-05-21 NOTE — ED Provider Notes (Signed)
CSN: [id number]     Arrival date & time 05/21/15  1120 History   First MD Initiated Contact with Patient 05/21/15 1244     Chief Complaint  Patient presents with  . Cough   (Consider location/radiation/quality/duration/timing/severity/associated sxs/prior Treatment) HPI   45 year old female who presents with cough and congestion fever that she states was  102. She is afebrile today  she is having is very severe headache today with some nausea as well. She has history of COPD  Past Medical History  Diagnosis Date  . COPD (chronic obstructive pulmonary disease) [facility])    Past Surgical History  Procedure Laterality Date  . Abdominal hysterectomy    . Bladder surgery     No family history on file. Social History  Substance Use Topics  . Smoking status: Current Every Day Smoker -- 1.00 packs/day    Types: Cigarettes  . Smokeless tobacco: None  . Alcohol Use: Yes     Comment: occasional   OB History    No data available     Review of Systems  Constitutional: Positive for fever, chills and activity change. Negative for appetite change and fatigue.  HENT: Positive for congestion, postnasal drip, rhinorrhea and sinus pressure.   Respiratory: Positive for cough and wheezing.   Neurological: Positive for headaches.  All other systems reviewed and are negative.   Allergies  Flagyl  Home Medications   Prior to Admission medications   Medication Sig Start Date End Date Taking? Authorizing Provider  albuterol (PROVENTIL HFA;VENTOLIN HFA) 108 (90 Base) MCG/ACT inhaler Inhale 1-2 puffs into the lungs every 6 (six) hours as needed for wheezing or shortness of breath. 05/21/15   Lutricia Feil, PA-C  albuterol (PROVENTIL) (2.5 MG/3ML) 0.083% nebulizer solution Take 3 mLs (2.5 mg total) by nebulization every 6 (six) hours as needed for wheezing or shortness of breath. 05/21/15   Lutricia Feil, PA-C  cephALEXin (KEFLEX) 500 MG capsule Take 500 mg by mouth 4 (four) times daily.     Historical Provider, MD  cyclobenzaprine (FLEXERIL) 10 MG tablet Take 1 tablet (10 mg total) by mouth at bedtime. 03/04/15   Payton Mccallum, MD  fluticasone (FLONASE) 50 MCG/ACT nasal spray Place 2 sprays into both nostrils daily. 05/21/15   Lutricia Feil, PA-C  ibuprofen (ADVIL,MOTRIN) 600 MG tablet Take 1 tablet (600 mg total) by mouth every 8 (eight) hours as needed for mild pain or moderate pain. 01/09/15   Renford Dills, NP  oxyCODONE (ROXICODONE) 15 MG immediate release tablet Take 1 tablet (15 mg total) by mouth every 8 (eight) hours as needed for pain. 03/04/15   Payton Mccallum, MD  traMADol (ULTRAM) 50 MG tablet Take 1 tablet (50 mg total) by mouth every 8 (eight) hours as needed (Do not drive or operate machinery while taking as can cause drowsiness.). 01/09/15   Renford Dills, NP   Meds Ordered and Administered this Visit   Medications  ketorolac (TORADOL) injection 60 mg (60 mg Intramuscular Given 05/21/15 1309)  ondansetron (ZOFRAN-ODT) disintegrating tablet 8 mg (8 mg Oral Given 05/21/15 1309)    BP 145/88 mmHg  Pulse 108  Temp(Src) 98 F (36.7 C) (Oral)  Resp 16  Ht  (1.626 m)  SpO2 96% No data found.   Physical Exam  Constitutional: She is oriented to person, place, and time. She appears well-developed and well-nourished. No distress.  HENT:  Head: Normocephalic and atraumatic.  Right Ear: External ear normal.  Left Ear: External ear normal.  Nose:  Nose normal.  Mouth/Throat: Oropharynx is clear and moist.  Eyes: Conjunctivae are normal. Pupils are equal, round, and reactive to light.  Neck: Normal range of motion. Neck supple.  Pulmonary/Chest: Effort normal. No respiratory distress. She has wheezes. She has no rales.  Musculoskeletal: Normal range of motion. She exhibits no edema or tenderness.  Neurological: She is alert and oriented to person, place, and time.  Skin: Skin is warm and dry. No rash noted. She is not diaphoretic. No erythema.  Psychiatric: She  has a normal mood and affect. Her behavior is normal. Judgment and thought content normal.  Nursing note and vitals reviewed.   ED Course  Procedures (including critical care time)  Labs Review Labs Reviewed  RAPID INFLUENZA A&B ANTIGENS [facility] ONLY)    Imaging Review No results found.   Visual Acuity Review  Right Eye Distance:   Left Eye Distance:   Bilateral Distance:    Right Eye Near:   Left Eye Near:    Bilateral Near:     13:09 Medication Given BF  ketorolac (TORADOL) injection 60 mg - Dose: 60 mg ; Route: Intramuscular ; Site: Left Ventrogluteal ; Scheduled Time: 1315      13:09 Medication Given BF  ondansetron (ZOFRAN-ODT) disintegrating tablet 8 mg - Dose: 8 mg ; Route: Oral ; Scheduled Time: 1315         MDM   1. Acute nonintractable headache, unspecified headache type   2. Acute URI   3. Chronic obstructive pulmonary disease with acute exacerbation (HCC)    New Prescriptions   ALBUTEROL (PROVENTIL HFA;VENTOLIN HFA) 108 (90 BASE) MCG/ACT INHALER    Inhale 1-2 puffs into the lungs every 6 (six) hours as needed for wheezing or shortness of breath.   ALBUTEROL (PROVENTIL) (2.5 MG/3ML) 0.083% NEBULIZER SOLUTION    Take 3 mLs (2.5 mg total) by nebulization every 6 (six) hours as needed for wheezing or shortness of breath.   FLUTICASONE (FLONASE) 50 MCG/ACT NASAL SPRAY    Place 2 sprays into both nostrils daily.  Plan: 1. Test/x-ray results and diagnosis reviewed with patient 2. rx as per orders; risks, benefits, potential side effects reviewed with patient 3. Recommend supportive treatment with rest and fluids. He has access to the nebulizers have given her a prescription for albuterol nebulizer as well as inhalers. Follow-up at [facility] next week with her regular PCP. Injection of Toradol today with good result for her headache. I also gave her some Zofran for the nausea. I told her she needs to stop smoking her the number for [place] stop smoking  hotline. 4. F/u prn if symptoms worsen or don't improve     Lutricia Feil, PA-C 05/21/15 1344

## 2015-05-21 NOTE — ED Notes (Signed)
Patient stated could not wait any longer. Received a phone call from childs school and must leave now. Colon Flattery informed and talked with family member. Patient left ambulatory with Rx's sent to [facility]

## 2015-05-21 NOTE — ED Notes (Signed)
Pt reports cough, congestion, and fever and generalized weakness that started 3 days ago.

## 2015-05-22 ENCOUNTER — Telehealth: Payer: Self-pay | Admitting: Emergency Medicine

## 2015-05-22 NOTE — ED Notes (Addendum)
Patient called stating that her prescriptions were sent to the wrong pharmacy and that she needed them to be called into the [facility].  Patient was notified that the prescriptions would be called into the [facility].  All three prescriptions (Flonase, Albuterol inhaler, and Albuterol neb. solution) that were previous sent to [facility] in [place] was called to the [facility].

## 2015-08-07 ENCOUNTER — Encounter: Payer: Self-pay | Admitting: Emergency Medicine

## 2015-08-07 ENCOUNTER — Ambulatory Visit
Admission: EM | Admit: 2015-08-07 | Discharge: 2015-08-07 | Disposition: A | Discharge status: Home / Self Care | Payer: Self-pay | Attending: Family Medicine | Admitting: Family Medicine

## 2015-08-07 DIAGNOSIS — J441 Chronic obstructive pulmonary disease with (acute) exacerbation: Secondary | ICD-10-CM

## 2015-08-07 DIAGNOSIS — J302 Other seasonal allergic rhinitis: Secondary | ICD-10-CM

## 2015-08-07 MED ORDER — HYDROCOD POLST-CPM POLST ER 10-8 MG/5ML PO SUER: 5.0000 mL | Freq: Two times a day (BID) | ORAL | Status: DC

## 2015-08-07 MED ORDER — PREDNISONE 20 MG PO TABS: ORAL_TABLET | ORAL | Status: DC

## 2015-08-07 MED ORDER — AZITHROMYCIN 250 MG PO TABS: ORAL_TABLET | ORAL | Status: DC

## 2015-08-07 MED ORDER — PREDNISONE 20 MG PO TABS
ORAL_TABLET | ORAL | Status: DC
Start: 2015-08-07 — End: 2015-08-07

## 2015-08-07 NOTE — ED Notes (Signed)
Patient c/o SOB that started yesterday.  Patient c/o cough and chest congestion for several months.  Patient denies fevers.  Patient denies chest pain.

## 2015-08-07 NOTE — ED Provider Notes (Signed)
CSN: [id number]     Arrival date & time 08/07/15  1747 History   First MD Initiated Contact with Patient 08/07/15 1838     Chief Complaint  Patient presents with  . Shortness of Breath   (Consider location/radiation/quality/duration/timing/severity/associated sxs/prior Treatment) HPI  Social 45 year old female who is well-known to our clinic presents with shortness of breath that started yesterday. The cough and chest congestion for several months. Her husband who accompanies her states that she's been outside almost all day long for the last couple days with the pollen count that is very high. Ears she denies any chest pain. She is very fatigued and has a feeling of weakness. She has a history of COPD and is on inhalers at home. Has not been using her nebulizer due to running out of medication and has difficulty affording the medications. She has been having a cough that is productive white sputum but sometimes green. O2 sats on room air is 100% today temperature 90.8 pulse 63 respirations 20 blood pressure 106/67    Past Medical History  Diagnosis Date  . COPD (chronic obstructive pulmonary disease) [facility])    Past Surgical History  Procedure Laterality Date  . Abdominal hysterectomy    . Bladder surgery     History reviewed. No pertinent family history. Social History  Substance Use Topics  . Smoking status: Current Every Day Smoker -- 1.00 packs/day    Types: Cigarettes  . Smokeless tobacco: None  . Alcohol Use: Yes     Comment: occasional   OB History    No data available     Review of Systems  Constitutional: Positive for activity change. Negative for fever, chills and fatigue.  HENT: Positive for congestion, postnasal drip, rhinorrhea and sinus pressure.   Respiratory: Positive for cough. Negative for shortness of breath, wheezing and stridor.   All other systems reviewed and are negative.   Allergies  Flagyl  Home Medications   Prior to Admission medications    Medication Sig Start Date End Date Taking? Authorizing Provider  albuterol (PROVENTIL HFA;VENTOLIN HFA) 108 (90 Base) MCG/ACT inhaler Inhale 1-2 puffs into the lungs every 6 (six) hours as needed for wheezing or shortness of breath. 05/21/15   Lutricia Feil, PA-C  albuterol (PROVENTIL) (2.5 MG/3ML) 0.083% nebulizer solution Take 3 mLs (2.5 mg total) by nebulization every 6 (six) hours as needed for wheezing or shortness of breath. 05/21/15   Lutricia Feil, PA-C  azithromycin (ZITHROMAX Z-PAK) 250 MG tablet Use as per package instructions 08/07/15   Lutricia Feil, PA-C  cephALEXin (KEFLEX) 500 MG capsule Take 500 mg by mouth 4 (four) times daily.    Historical Provider, MD  chlorpheniramine-HYDROcodone (TUSSIONEX PENNKINETIC ER) 10-8 MG/5ML SUER Take 5 mLs by mouth 2 (two) times daily. 08/07/15   Lutricia Feil, PA-C  cyclobenzaprine (FLEXERIL) 10 MG tablet Take 1 tablet (10 mg total) by mouth at bedtime. 03/04/15   Payton Mccallum, MD  fluticasone (FLONASE) 50 MCG/ACT nasal spray Place 2 sprays into both nostrils daily. 05/21/15   Lutricia Feil, PA-C  ibuprofen (ADVIL,MOTRIN) 600 MG tablet Take 1 tablet (600 mg total) by mouth every 8 (eight) hours as needed for mild pain or moderate pain. 01/09/15   Renford Dills, NP  oxyCODONE (ROXICODONE) 15 MG immediate release tablet Take 1 tablet (15 mg total) by mouth every 8 (eight) hours as needed for pain. 03/04/15   Payton Mccallum, MD  predniSONE (DELTASONE) 20 MG tablet Take 2 tablets (40 mg)  daily by mouth 08/07/15   Lutricia FeilWilliam P Rowen Hur, PA-C  traMADol (ULTRAM) 50 MG tablet Take 1 tablet (50 mg total) by mouth every 8 (eight) hours as needed (Do not drive or operate machinery while taking as can cause drowsiness.). 01/09/15   Renford DillsLindsey Miller, NP   Meds Ordered and Administered this Visit  Medications - No data to display  BP 106/67 mmHg  Pulse 63  Temp(Src) 98 F (36.7 C) (Oral)  Resp 20  Ht 5\' 4"  (1.626 m)  Wt 125 lb (56.7 kg)  BMI 21.45 kg/m2   SpO2 100% No data found.   Physical Exam  Constitutional: She is oriented to person, place, and time. She appears well-developed and well-nourished. No distress.  HENT:  Head: Normocephalic and atraumatic.  Right Ear: External ear normal.  Left Ear: External ear normal.  Nose: Nose normal.  Eyes: Conjunctivae are normal. Pupils are equal, round, and reactive to light.  Neck: Normal range of motion. Neck supple.  Pulmonary/Chest: Effort normal and breath sounds normal. No respiratory distress. She has no wheezes. She has no rales.  Musculoskeletal: Normal range of motion. She exhibits no edema or tenderness.  Lymphadenopathy:    She has no cervical adenopathy.  Neurological: She is alert and oriented to person, place, and time.  Skin: Skin is warm and dry. She is not diaphoretic.  Psychiatric: She has a normal mood and affect. Her behavior is normal. Judgment and thought content normal.  Nursing note and vitals reviewed.   ED Course  Procedures (including critical care time)  Labs Review Labs Reviewed - No data to display  Imaging Review No results found.   Visual Acuity Review  Right Eye Distance:   Left Eye Distance:   Bilateral Distance:    Right Eye Near:   Left Eye Near:    Bilateral Near:         MDM   1. COPD with exacerbation (HCC)   2. Seasonal allergies    Discharge Medication List as of 08/07/2015  6:57 PM    START taking these medications   Details  chlorpheniramine-HYDROcodone (TUSSIONEX PENNKINETIC ER) 10-8 MG/5ML SUER Take 5 mLs by mouth 2 (two) times daily., Starting 08/07/2015, Until Discontinued, Print    azithromycin (ZITHROMAX Z-PAK) 250 MG tablet Use as per package instructions, Normal    predniSONE (DELTASONE) 20 MG tablet Take 2 tablets (40 mg) daily by mouth, Normal      Plan: 1. Test/x-ray results and diagnosis reviewed with patient 2. rx as per orders; risks, benefits, potential side effects reviewed with patient 3. Recommend  supportive treatment with cessation of smoking once again. Her COPD and exacerbation of start her on antibiotics and give her some prednisone. Present time she did not sound tight and a reaming was not given to her tonight. Encouraged her to continue using her albuterol at home. I'm certain that there is a component of seasonal allergies due to the high pollen counts we've been expressing any area and encouraged her to use Flonase and Claritin Allegra or his Zyrtec during the spring and fall seasons. 4. F/u prn if symptoms worsen or don't improve     Lutricia FeilWilliam P Zorion Nims, PA-C 08/07/15 1914

## 2015-08-07 NOTE — Discharge Instructions (Signed)
Allergies An allergy is an abnormal reaction to a substance by the body's defense system (immune system). Allergies can develop at any age. WHAT CAUSES ALLERGIES? An allergic reaction happens when the immune system mistakenly reacts to a normally harmless substance, called an allergen, as if it were harmful. The immune system releases antibodies to fight the substance. Antibodies eventually release a chemical called histamine into the bloodstream. The release of histamine is meant to protect the body from infection, but it also causes discomfort. An allergic reaction can be triggered by:  Eating an allergen.  Inhaling an allergen.  Touching an allergen. WHAT TYPES OF ALLERGIES ARE THERE? There are many types of allergies. Common types include:  Seasonal allergies. People with this type of allergy are usually allergic to substances that are only present during certain seasons, such as molds and pollens.  Food allergies.  Drug allergies.  Insect allergies.  Animal dander allergies. WHAT ARE SYMPTOMS OF ALLERGIES? Possible allergy symptoms include:  Swelling of the lips, face, tongue, mouth, or throat.  Sneezing, coughing, or wheezing.  Nasal congestion.  Tingling in the mouth.  Rash.  Itching.  Itchy, red, swollen areas of skin (hives).  Watery eyes.  Vomiting.  Diarrhea.  Dizziness.  Lightheadedness.  Fainting.  Trouble breathing or swallowing.  Chest tightness.  Rapid heartbeat. HOW ARE ALLERGIES DIAGNOSED? Allergies are diagnosed with a medical and family history and one or more of the following:  Skin tests.  Blood tests.  A food diary. A food diary is a record of all the foods and drinks you have in a day and of all the symptoms you experience.  The results of an elimination diet. An elimination diet involves eliminating foods from your diet and then adding them back in one by one to find out if a certain food causes an allergic reaction. HOW ARE  ALLERGIES TREATED? There is no cure for allergies, but allergic reactions can be treated with medicine. Severe reactions usually need to be treated at a hospital. HOW CAN REACTIONS BE PREVENTED? The best way to prevent an allergic reaction is by avoiding the substance you are allergic to. Allergy shots and medicines can also help prevent reactions in some cases. People with severe allergic reactions may be able to prevent a life-threatening reaction called anaphylaxis with a medicine given right after exposure to the allergen.   This information is not intended to replace advice given to you by your health care provider. Make sure you discuss any questions you have with your health care provider.   Document Released: 06/28/2002 Document Revised: 04/25/2014 Document Reviewed: 01/14/2014 Elsevier Interactive Patient Education 2016 Elsevier Inc.  Chronic Obstructive Pulmonary Disease Chronic obstructive pulmonary disease (COPD) is a common lung condition in which airflow from the lungs is limited. COPD is a general term that can be used to describe many different lung problems that limit airflow, including both chronic bronchitis and emphysema. If you have COPD, your lung function will probably never return to normal, but there are measures you can take to improve lung function and make yourself feel better. CAUSES   Smoking (common).  Exposure to secondhand smoke.  Genetic problems.  Chronic inflammatory lung diseases or recurrent infections. SYMPTOMS  Shortness of breath, especially with physical activity.  Deep, persistent (chronic) cough with a large amount of thick mucus.  Wheezing.  Rapid breaths (tachypnea).  Gray or bluish discoloration (cyanosis) of the skin, especially in your fingers, toes, or lips.  Fatigue.  Weight loss.  Frequent  infections or episodes when breathing symptoms become much worse (exacerbations).  Chest tightness. DIAGNOSIS Your health care provider  will take a medical history and perform a physical examination to diagnose COPD. Additional tests for COPD may include:  Lung (pulmonary) function tests.  Chest X-ray.  CT scan.  Blood tests. TREATMENT  Treatment for COPD may include:  Inhaler and nebulizer medicines. These help manage the symptoms of COPD and make your breathing more comfortable.  Supplemental oxygen. Supplemental oxygen is only helpful if you have a low oxygen level in your blood.  Exercise and physical activity. These are beneficial for nearly all people with COPD.  Lung surgery or transplant.  Nutrition therapy to gain weight, if you are underweight.  Pulmonary rehabilitation. This may involve working with a team of health care providers and specialists, such as respiratory, occupational, and physical therapists. HOME CARE INSTRUCTIONS  Take all medicines (inhaled or pills) as directed by your health care provider.  Avoid over-the-counter medicines or cough syrups that dry up your airway (such as antihistamines) and slow down the elimination of secretions unless instructed otherwise by your health care provider.  If you are a smoker, the most important thing that you can do is stop smoking. Continuing to smoke will cause further lung damage and breathing trouble. Ask your health care provider for help with quitting smoking. He or she can direct you to community resources or hospitals that provide support.  Avoid exposure to irritants such as smoke, chemicals, and fumes that aggravate your breathing.  Use oxygen therapy and pulmonary rehabilitation if directed by your health care provider. If you require home oxygen therapy, ask your health care provider whether you should purchase a pulse oximeter to measure your oxygen level at home.  Avoid contact with individuals who have a contagious illness.  Avoid extreme temperature and humidity changes.  Eat healthy foods. Eating smaller, more frequent meals and  resting before meals may help you maintain your strength.  Stay active, but balance activity with periods of rest. Exercise and physical activity will help you maintain your ability to do things you want to do.  Preventing infection and hospitalization is very important when you have COPD. Make sure to receive all the vaccines your health care provider recommends, especially the pneumococcal and influenza vaccines. Ask your health care provider whether you need a pneumonia vaccine.  Learn and use relaxation techniques to manage stress.  Learn and use controlled breathing techniques as directed by your health care provider. Controlled breathing techniques include:  Pursed lip breathing. Start by breathing in (inhaling) through your nose for 1 second. Then, purse your lips as if you were going to whistle and breathe out (exhale) through the pursed lips for 2 seconds.  Diaphragmatic breathing. Start by putting one hand on your abdomen just above your waist. Inhale slowly through your nose. The hand on your abdomen should move out. Then purse your lips and exhale slowly. You should be able to feel the hand on your abdomen moving in as you exhale.  Learn and use controlled coughing to clear mucus from your lungs. Controlled coughing is a series of short, progressive coughs. The steps of controlled coughing are: 1. Lean your head slightly forward. 2. Breathe in deeply using diaphragmatic breathing. 3. Try to hold your breath for 3 seconds. 4. Keep your mouth slightly open while coughing twice. 5. Spit any mucus out into a tissue. 6. Rest and repeat the steps once or twice as needed. SEEK MEDICAL CARE  IF:  You are coughing up more mucus than usual.  There is a change in the color or thickness of your mucus.  Your breathing is more labored than usual.  Your breathing is faster than usual. SEEK IMMEDIATE MEDICAL CARE IF:  You have shortness of breath while you are resting.  You have  shortness of breath that prevents you from:  Being able to talk.  Performing your usual physical activities.  You have chest pain lasting longer than 5 minutes.  Your skin color is more cyanotic than usual.  You measure low oxygen saturations for longer than 5 minutes with a pulse oximeter. MAKE SURE YOU:  Understand these instructions.  Will watch your condition.  Will get help right away if you are not doing well or get worse.   This information is not intended to replace advice given to you by your health care provider. Make sure you discuss any questions you have with your health care provider.   Document Released: 01/12/2005 Document Revised: 04/25/2014 Document Reviewed: 11/29/2012 Elsevier Interactive Patient Education [employer]2016 Elsevier Inc.

## 2015-12-13 DIAGNOSIS — R87619 Unspecified abnormal cytological findings in specimens from cervix uteri: Secondary | ICD-10-CM | POA: Insufficient documentation

## 2017-04-21 NOTE — Telephone Encounter (Signed)
close

## 2018-04-02 ENCOUNTER — Other Ambulatory Visit: Payer: Self-pay

## 2018-04-02 ENCOUNTER — Encounter: Payer: Self-pay | Admitting: Emergency Medicine

## 2018-04-02 ENCOUNTER — Ambulatory Visit
Admission: EM | Admit: 2018-04-02 | Discharge: 2018-04-02 | Disposition: A | Discharge status: Other Acute Inpatient Hospital | Payer: Self-pay | Attending: Family Medicine | Admitting: Family Medicine

## 2018-04-02 DIAGNOSIS — R002 Palpitations: Secondary | ICD-10-CM | POA: Insufficient documentation

## 2018-04-02 DIAGNOSIS — F17210 Nicotine dependence, cigarettes, uncomplicated: Secondary | ICD-10-CM | POA: Insufficient documentation

## 2018-04-02 DIAGNOSIS — Z9049 Acquired absence of other specified parts of digestive tract: Secondary | ICD-10-CM | POA: Insufficient documentation

## 2018-04-02 DIAGNOSIS — J439 Emphysema, unspecified: Secondary | ICD-10-CM | POA: Insufficient documentation

## 2018-04-02 DIAGNOSIS — R55 Syncope and collapse: Secondary | ICD-10-CM | POA: Insufficient documentation

## 2018-04-02 DIAGNOSIS — Z7952 Long term (current) use of systemic steroids: Secondary | ICD-10-CM | POA: Insufficient documentation

## 2018-04-02 DIAGNOSIS — R079 Chest pain, unspecified: Secondary | ICD-10-CM | POA: Insufficient documentation

## 2018-04-02 DIAGNOSIS — Z9889 Other specified postprocedural states: Secondary | ICD-10-CM | POA: Insufficient documentation

## 2018-04-02 DIAGNOSIS — Z79899 Other long term (current) drug therapy: Secondary | ICD-10-CM | POA: Insufficient documentation

## 2018-04-02 HISTORY — DX: Emphysema, unspecified: J43.9

## 2018-04-02 LAB — GLUCOSE, CAPILLARY: Glucose-Capillary: 88 mg/dL (ref 70–99)

## 2018-04-02 NOTE — ED Triage Notes (Signed)
Patient c/o chest pain off and on for a week.  Patient reports left shoulder blade pain now.  Patient states that she has passed out three times over the past week.  Patient states that today she passed out while at work.  Patient denies any injury.  Patient also reports some SOB.

## 2018-04-02 NOTE — ED Provider Notes (Signed)
[facility]    CSN: [id number] Arrival date & time: 04/02/18  1557     History   Chief Complaint Chief Complaint  Patient presents with  . Chest Pain  . Loss of Consciousness    HPI Phyllis Edwards is a 47 y.o. female.   47 yo female with a c/o chest pains on and off for the past week as well as episodes of passing out for the past week. States she passed out today while at work and does not know how long she was out but does not think it was very long. States that episodes are accompanied by sensation of palpitations and shortness of breath. Currently denies any chest pains, numbness/tingling, one-sided weakness, shortness of breath, vision changes.    The history is provided by the patient.    Past Medical History:  Diagnosis Date  . COPD (chronic obstructive pulmonary disease) (HCC)   . Emphysema of lung (HCC)     There are no active problems to display for this patient.   Past Surgical History:  Procedure Laterality Date  . ABDOMINAL HYSTERECTOMY    . BLADDER SURGERY    . CHOLECYSTECTOMY      OB History   No obstetric history on file.      Home Medications    Prior to Admission medications   Medication Sig Start Date End Date Taking? Authorizing Provider  albuterol (PROVENTIL HFA;VENTOLIN HFA) 108 (90 Base) MCG/ACT inhaler Inhale 1-2 puffs into the lungs every 6 (six) hours as needed for wheezing or shortness of breath. 05/21/15   Lutricia Feil, PA-C  albuterol (PROVENTIL) (2.5 MG/3ML) 0.083% nebulizer solution Take 3 mLs (2.5 mg total) by nebulization every 6 (six) hours as needed for wheezing or shortness of breath. 05/21/15   Lutricia Feil, PA-C  azithromycin (ZITHROMAX Z-PAK) 250 MG tablet Use as per package instructions 08/07/15   Lutricia Feil, PA-C  cephALEXin (KEFLEX) 500 MG capsule Take 500 mg by mouth 4 (four) times daily.    [provider]  chlorpheniramine-HYDROcodone (TUSSIONEX PENNKINETIC ER) 10-8 MG/5ML SUER Take 5  mLs by mouth 2 (two) times daily. 08/07/15   Lutricia Feil, PA-C  cyclobenzaprine (FLEXERIL) 10 MG tablet Take 1 tablet (10 mg total) by mouth at bedtime. 03/04/15   Payton Mccallum, MD  fluticasone (FLONASE) 50 MCG/ACT nasal spray Place 2 sprays into both nostrils daily. 05/21/15   Lutricia Feil, PA-C  ibuprofen (ADVIL,MOTRIN) 600 MG tablet Take 1 tablet (600 mg total) by mouth every 8 (eight) hours as needed for mild pain or moderate pain. 01/09/15   Renford Dills, NP  oxyCODONE (ROXICODONE) 15 MG immediate release tablet Take 1 tablet (15 mg total) by mouth every 8 (eight) hours as needed for pain. 03/04/15   Payton Mccallum, MD  predniSONE (DELTASONE) 20 MG tablet Take 2 tablets (40 mg) daily by mouth 08/07/15   Lutricia Feil, PA-C  traMADol (ULTRAM) 50 MG tablet Take 1 tablet (50 mg total) by mouth every 8 (eight) hours as needed (Do not drive or operate machinery while taking as can cause drowsiness.). 01/09/15   Renford Dills, NP    Family History Family History  Problem Relation Age of Onset  . Melanoma Mother   . Cancer Father     Social History Social History   Tobacco Use  . Smoking status: Current Every Day Smoker    Packs/day: 1.00    Types: Cigarettes  . Smokeless tobacco: Never Used  Substance Use  Topics  . Alcohol use: Yes    Comment: occasional  . Drug use: Yes    Types: Cocaine     Allergies   Patient has no known allergies.   Review of Systems Review of Systems   Physical Exam Triage Vital Signs ED Triage Vitals  Enc Vitals Group     BP 04/02/18 1641 123/90     Pulse Rate 04/02/18 1641 70     Resp 04/02/18 1641 14     Temp 04/02/18 1641 98.4 F (36.9 C)     Temp Source 04/02/18 1641 Oral     SpO2 04/02/18 1641 100 %     Weight 04/02/18 1635 128 lb (58.1 kg)     Height 04/02/18 1635 5\' 4"  (1.626 m)     Head Circumference --      Peak Flow --      Pain Score 04/02/18 1634 6     Pain Loc --      Pain Edu? --      Excl. in GC? --     No data found.  Updated Vital Signs BP 123/90 (BP Location: Left Arm)   Pulse 70   Temp 98.4 F (36.9 C) (Oral)   Resp 14   Ht 5\' 4"  (1.626 m)   Wt 58.1 kg   SpO2 100%   BMI 21.97 kg/m   Visual Acuity Right Eye Distance:   Left Eye Distance:   Bilateral Distance:    Right Eye Near:   Left Eye Near:    Bilateral Near:     Physical Exam Vitals signs and nursing note reviewed.  Constitutional:      General: She is not in acute distress.    Appearance: She is not toxic-appearing or diaphoretic.  Cardiovascular:     Rate and Rhythm: Normal rate and regular rhythm.     Pulses: Normal pulses.     Heart sounds: Normal heart sounds. No murmur. No friction rub. No gallop.   Neurological:     General: No focal deficit present.     Mental Status: She is alert and oriented to person, place, and time.     Cranial Nerves: No cranial nerve deficit.     Sensory: No sensory deficit.     Motor: No weakness.     Coordination: Coordination normal.     Gait: Gait normal.     Deep Tendon Reflexes: Reflexes normal.      UC Treatments / Results  Labs (all labs ordered are listed, but only abnormal results are displayed) Labs Reviewed  GLUCOSE, CAPILLARY    EKG None  Radiology No results found.  Procedures ED EKG Date/Time: 04/02/2018 7:30 PM Performed by: Payton Mccallum, MD Authorized by: Payton Mccallum, MD   ECG reviewed by ED Physician in the absence of a cardiologist: yes   Previous ECG:    Previous ECG:  Unavailable Interpretation:    Interpretation: normal   Rate:    ECG rate:  60   ECG rate assessment: normal   Rhythm:    Rhythm: sinus rhythm   Ectopy:    Ectopy: none   QRS:    QRS axis:  Normal   QRS intervals:  Normal Conduction:    Conduction: normal   ST segments:    ST segments:  Normal T waves:    T waves: normal     (including critical care time)  Medications Ordered in UC Medications - No data to display  Initial Impression /  Assessment  and Plan / UC Course  I have reviewed the triage vital signs and the nursing notes.  Pertinent labs & imaging results that were available during my care of the patient were reviewed by me and considered in my medical decision making (see chart for details).      Final Clinical Impressions(s) / UC Diagnoses   Final diagnoses:  Syncope, unspecified syncope type  Palpitations     Discharge Instructions     Recommend patient go to Emergency Department for further evaluation and management    ED Prescriptions    None     1. ekg result and diagnosis reviewed with patient; recommend patient go to Emergency Department for further evaluation and management; patient in stable condition will be transported by private vehicle  Controlled Substance Prescriptions [place] Controlled Substance Registry consulted? Not Applicable   Payton Mccallumonty, Amour Trigg, MD 04/02/18 [phone]

## 2018-04-02 NOTE — Discharge Instructions (Signed)
Recommend patient go to Emergency Department for further evaluation and management °

## 2018-06-23 ENCOUNTER — Other Ambulatory Visit: Payer: Self-pay

## 2018-06-23 ENCOUNTER — Ambulatory Visit
Admission: EM | Admit: 2018-06-23 | Discharge: 2018-06-23 | Disposition: A | Discharge status: Home / Self Care | Payer: Medicaid Other | Attending: Family Medicine | Admitting: Family Medicine

## 2018-06-23 DIAGNOSIS — N760 Acute vaginitis: Secondary | ICD-10-CM

## 2018-06-23 DIAGNOSIS — F17210 Nicotine dependence, cigarettes, uncomplicated: Secondary | ICD-10-CM

## 2018-06-23 DIAGNOSIS — J441 Chronic obstructive pulmonary disease with (acute) exacerbation: Secondary | ICD-10-CM

## 2018-06-23 DIAGNOSIS — Z8709 Personal history of other diseases of the respiratory system: Secondary | ICD-10-CM

## 2018-06-23 DIAGNOSIS — B9689 Other specified bacterial agents as the cause of diseases classified elsewhere: Secondary | ICD-10-CM | POA: Insufficient documentation

## 2018-06-23 LAB — WET PREP, GENITAL
Sperm: NONE SEEN
Trich, Wet Prep: NONE SEEN
Yeast Wet Prep HPF POC: NONE SEEN

## 2018-06-23 LAB — RAPID STREP SCREEN (MED CTR MEBANE ONLY): STREPTOCOCCUS, GROUP A SCREEN (DIRECT): NEGATIVE

## 2018-06-23 MED ORDER — PREDNISONE 50 MG PO TABS: 50.0000 mg | ORAL_TABLET | Freq: Every day | ORAL | 0 refills | Status: DC

## 2018-06-23 MED ORDER — ALBUTEROL SULFATE (2.5 MG/3ML) 0.083% IN NEBU: 2.5000 mg | INHALATION_SOLUTION | Freq: Four times a day (QID) | RESPIRATORY_TRACT | 12 refills | Status: DC | PRN

## 2018-06-23 MED ORDER — DOXYCYCLINE HYCLATE 100 MG PO CAPS: 100.0000 mg | ORAL_CAPSULE | Freq: Two times a day (BID) | ORAL | 0 refills | Status: DC

## 2018-06-23 MED ORDER — METRONIDAZOLE 500 MG PO TABS: 500.0000 mg | ORAL_TABLET | Freq: Two times a day (BID) | ORAL | 0 refills | Status: DC

## 2018-06-23 NOTE — ED Provider Notes (Signed)
[facility]    CSN: [id number] Arrival date & time: 06/23/18  1403     History   Chief Complaint Chief Complaint  Patient presents with  . Cough  . Vaginitis    HPI Phyllis Edwards is a 48 y.o. female.   HPI  -year-old female presents with 2 separate issues.  She has of COPD with a recent exacerbation and also BV.  Patient has a long history of smoking currently smokes about a pack a day.  She has a history of COPD for about 10 years.  Been seen in our clinic on several occasions with the similar presentations.  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  She is having coughing productive along with shortness of breath.  Patient states that she does have an albuterol inhaler at home but has run out of her prescription for her nebulizer.  No fever or chills.  Problem is that of vaginal discharge that has a "BV smell".  Recently started new relationship for the last month.  She states that this is monogamous female partner.  She is not concerned regarding  STDs.           Past Medical History:  Diagnosis Date  . COPD (chronic obstructive pulmonary disease) (HCC)   . Emphysema of lung (HCC)     There are no active problems to display for this patient.   Past Surgical History:  Procedure Laterality Date  . ABDOMINAL HYSTERECTOMY    . BLADDER SURGERY    . CHOLECYSTECTOMY      OB History   No obstetric history on file.      Home Medications    Prior to Admission medications   Medication Sig Start Date End Date Taking? Authorizing Provider  albuterol (PROVENTIL HFA;VENTOLIN HFA) 108 (90 Base) MCG/ACT inhaler Inhale 1-2 puffs into the lungs every 6 (six) hours as needed for wheezing or shortness of breath. 05/21/15   Lutricia Feil, PA-C  albuterol (PROVENTIL) (2.5 MG/3ML) 0.083% nebulizer solution Take 3 mLs (2.5 mg total) by nebulization every 6 (six) hours as needed for  wheezing or shortness of breath. 06/23/18   Lutricia Feil, PA-C  cyclobenzaprine (FLEXERIL) 10 MG tablet Take 1 tablet (10 mg total) by mouth at bedtime. 03/04/15   Payton Mccallum, MD  doxycycline (VIBRAMYCIN) 100 MG capsule Take 1 capsule (100 mg total) by mouth 2 (two) times daily. 06/23/18   Lutricia Feil, PA-C  ibuprofen (ADVIL,MOTRIN) 600 MG tablet Take 1 tablet (600 mg total) by mouth every 8 (eight) hours as needed for mild pain or moderate pain. 01/09/15   Renford Dills, NP  metroNIDAZOLE (FLAGYL) 500 MG tablet Take 1 tablet (500 mg total) by mouth 2 (two) times daily. 06/23/18   Lutricia Feil, PA-C  oxyCODONE (ROXICODONE) 15 MG immediate release tablet Take 1 tablet (15 mg total) by mouth every 8 (eight) hours as needed for pain. 03/04/15   Payton Mccallum, MD  predniSONE (DELTASONE) 50 MG tablet Take 1 tablet (50 mg total) by mouth daily. 06/23/18   Lutricia Feil, PA-C    Family History Family History  Problem Relation Age of Onset  . Melanoma Mother   . Cancer Father     Social History Social History   Tobacco Use  . Smoking status: Current Every Day Smoker    Packs/day: 1.00    Types: Cigarettes  . Smokeless tobacco: Never Used  Substance Use Topics  . Alcohol use: Yes    Comment: occasional  . Drug use: Yes    Types: Cocaine, Marijuana    Comment: last use last week for cocaine     Allergies   Patient has no known allergies.   Review of Systems Review of Systems  Constitutional: Positive for activity change. Negative for chills, fatigue and fever.  HENT: Positive for congestion and sore throat.   Respiratory: Positive for cough, shortness of breath and wheezing.   Genitourinary: Positive for vaginal discharge.  All other systems reviewed and are negative.    Physical Exam Triage Vital Signs ED Triage Vitals  Enc Vitals Group     BP 06/23/18 1414 111/73     Pulse Rate 06/23/18 1414 87     Resp 06/23/18 1414 16     Temp 06/23/18 1414 98 F  (36.7 C)     Temp Source 06/23/18 1414 Oral     SpO2 06/23/18 1414 96 %     Weight 06/23/18 1412 124 lb (56.2 kg)     Height 06/23/18 1412  (1.626 m)     Head Circumference --      Peak Flow --      Pain Score 06/23/18 1412 6     Pain Loc --      Pain Edu? --      Excl. in GC? --    No data found.  Updated Vital Signs BP 111/73 (BP Location: Left Arm)   Pulse 87   Temp 98 F (36.7 C) (Oral)   Resp 16   Ht  (1.626 m)   Wt 124 lb (56.2 kg)   SpO2 96%   BMI 21.28 kg/m   Visual Acuity Right Eye Distance:   Left Eye Distance:   Bilateral Distance:    Right Eye Near:   Left Eye Near:    Bilateral Near:     Physical Exam Vitals signs and nursing note reviewed.  Constitutional:      General: She is not in acute distress.    Appearance: Normal appearance. She is normal weight. She is not ill-appearing, toxic-appearing or diaphoretic.  HENT:     Head: Normocephalic and atraumatic.     Right Ear: Tympanic membrane, ear canal and external ear normal.     Left Ear: Tympanic membrane, ear canal and external ear normal.     Nose: Nose normal. No congestion or rhinorrhea.     Mouth/Throat:     Mouth: Mucous membranes are moist.     Pharynx: Oropharynx is clear. No oropharyngeal exudate or posterior oropharyngeal erythema.  Eyes:     General:        Right eye: No discharge.        Left eye: No discharge.     Conjunctiva/sclera: Conjunctivae normal.  Neck:     Musculoskeletal: Normal range of motion and neck supple.  Pulmonary:     Effort: Pulmonary effort is normal.     Breath sounds: Normal breath sounds.  Musculoskeletal: Normal range of motion.  Lymphadenopathy:     Cervical: No cervical adenopathy.  Skin:    General: Skin is warm and dry.  Neurological:     General: No focal deficit present.     Mental Status: She is alert and oriented to person, place, and time.  Psychiatric:        Mood and Affect: Mood normal.        Behavior: Behavior normal.  Thought Content: Thought content normal.        Judgment: Judgment normal.      UC Treatments / Results  Labs (all labs ordered are listed, but only abnormal results are displayed) Labs Reviewed  WET PREP, GENITAL - Abnormal; Notable for the following components:      Result Value   Clue Cells Wet Prep HPF POC PRESENT (*)    WBC, Wet Prep HPF POC RARE (*)    All other components within normal limits  RAPID STREP SCREEN ([facility] ONLY)  CULTURE, GROUP A STREP [facility])    EKG None  Radiology No results found.  Procedures Procedures (including critical care time)  Medications Ordered in UC Medications - No data to display  Initial Impression / Assessment and Plan / UC Course  I have reviewed the triage vital signs and the nursing notes.  Pertinent labs & imaging results that were available during my care of the patient were reviewed by me and considered in my medical decision making (see chart for details).   Will treat the patient with doxycycline prednisone and renew her albuterol nebulizer solution.  She needs to follow-up with her primary care physician for attempting to better control her the OPD.  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  The BV will also place her on Flagyl.  She should not drink during the time she is taking the medication and should abstain from sex.   Final Clinical Impressions(s) / UC Diagnoses   Final diagnoses:  COPD exacerbation (HCC)  BV (bacterial vaginosis)   Discharge Instructions   None    ED Prescriptions    Medication Sig Dispense Auth. Provider   albuterol (PROVENTIL) (2.5 MG/3ML) 0.083% nebulizer solution Take 3 mLs (2.5 mg total) by nebulization every 6 (six) hours as needed for wheezing or shortness of breath. 75 mL Ovid Curd P, PA-C   doxycycline (VIBRAMYCIN) 100 MG capsule Take 1 capsule (100 mg total) by mouth 2 (two) times daily. 14 capsule Ovid Curd P, PA-C   metroNIDAZOLE (FLAGYL) 500 MG tablet Take 1 tablet (500 mg total) by mouth 2 (two) times daily. 14 tablet Ovid Curd P, PA-C   predniSONE (DELTASONE) 50 MG tablet Take 1 tablet (50 mg total) by mouth daily. 5 tablet Lutricia Feil, PA-C     Controlled Substance Prescriptions [place] Controlled Substance Registry consulted? Not Applicable   Lutricia Feil, PA-C 06/23/18 1642

## 2018-06-23 NOTE — ED Triage Notes (Signed)
Pt reports "I think I have bronchitis and BV." Pt with cough that she reports is consistent with her past bronchitis. Sore throat 6/10. Pt reports she was exposed to strep last week. Pt also with vaginal discharge and "BV smell."

## 2018-06-26 LAB — CULTURE, GROUP A STREP (THRC)

## 2020-08-18 ENCOUNTER — Other Ambulatory Visit: Payer: Self-pay

## 2020-08-18 ENCOUNTER — Emergency Department: Payer: BC Managed Care – PPO

## 2020-08-18 ENCOUNTER — Emergency Department
Admission: EM | Admit: 2020-08-18 | Discharge: 2020-08-18 | Disposition: A | Discharge status: Home / Self Care | Payer: BC Managed Care – PPO | Attending: Emergency Medicine | Admitting: Emergency Medicine

## 2020-08-18 DIAGNOSIS — F17210 Nicotine dependence, cigarettes, uncomplicated: Secondary | ICD-10-CM | POA: Diagnosis not present

## 2020-08-18 DIAGNOSIS — R0602 Shortness of breath: Secondary | ICD-10-CM | POA: Diagnosis present

## 2020-08-18 DIAGNOSIS — J441 Chronic obstructive pulmonary disease with (acute) exacerbation: Secondary | ICD-10-CM | POA: Insufficient documentation

## 2020-08-18 LAB — BASIC METABOLIC PANEL
Anion gap: 6 (ref 5–15)
BUN: 10 mg/dL (ref 6–20)
CO2: 25 mmol/L (ref 22–32)
Calcium: 8.9 mg/dL (ref 8.9–10.3)
Chloride: 104 mmol/L (ref 98–111)
Creatinine, Ser: 0.55 mg/dL (ref 0.44–1.00)
GFR, Estimated: >60 mL/min (ref >60)
Glucose, Bld: 98 mg/dL (ref 70–99)
Potassium: 3.5 mmol/L (ref 3.5–5.1)
Sodium: 135 mmol/L (ref 135–145)

## 2020-08-18 LAB — CBC
HCT: 38.6 % (ref 36.0–46.0)
Hemoglobin: 13.2 g/dL (ref 12.0–15.0)
MCH: 31.9 pg (ref 26.0–34.0)
MCHC: 34.2 g/dL (ref 30.0–36.0)
MCV: 93.2 fL (ref 80.0–100.0)
Platelets: 225 10*3/uL (ref 150–400)
RBC: 4.14 MIL/uL (ref 3.87–5.11)
RDW: 13.1 % (ref 11.5–15.5)
WBC: 10 10*3/uL (ref 4.0–10.5)
nRBC: 0 % (ref 0.0–0.2)

## 2020-08-18 MED ORDER — IPRATROPIUM-ALBUTEROL 0.5-2.5 (3) MG/3ML IN SOLN
3.0000 mL | Freq: Once | RESPIRATORY_TRACT | Status: AC
  Administered 2020-08-18: 3 mL via RESPIRATORY_TRACT
  Filled 2020-08-18: qty 3

## 2020-08-18 MED ORDER — PREDNISONE 20 MG PO TABS: 60.0000 mg | ORAL_TABLET | Freq: Every day | ORAL | 0 refills | Status: AC

## 2020-08-18 MED ORDER — ALBUTEROL SULFATE HFA 108 (90 BASE) MCG/ACT IN AERS
4.0000 | INHALATION_SPRAY | Freq: Once | RESPIRATORY_TRACT | Status: AC
  Administered 2020-08-18: 4 via RESPIRATORY_TRACT
  Filled 2020-08-18: qty 6.7

## 2020-08-18 MED ORDER — ALBUTEROL SULFATE (2.5 MG/3ML) 0.083% IN NEBU: 2.5000 mg | INHALATION_SOLUTION | Freq: Four times a day (QID) | RESPIRATORY_TRACT | 0 refills | Status: DC | PRN

## 2020-08-18 MED ORDER — PREDNISONE 20 MG PO TABS
60.0000 mg | ORAL_TABLET | Freq: Once | ORAL | Status: AC
  Administered 2020-08-18: 60 mg via ORAL
  Filled 2020-08-18: qty 3

## 2020-08-18 NOTE — ED Notes (Signed)
Pt verbalized understanding of d/c instructions at this time and was given the opportunity to ask questions. Pt ambulatory to ED lobby to wait for ride, NAD noted, steady gait noted, RR even and unlabored at this time
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
## 2020-08-18 NOTE — ED Provider Notes (Signed)
[facility] Emergency Department Provider Note  ____________________________________________   Event Date/Time   First MD Initiated Contact with Patient 08/18/20 [phone]     (approximate)  I have reviewed the triage vital signs and the nursing notes.   HISTORY  Chief Complaint Shortness of Breath    HPI Phyllis Edwards is a 50 y.o. female with history of COPD here with shortness of breath and cough.  The patient states that her symptoms started yesterday.  She was helping a friend clean her house.  She states that her friend used bleach to clean the walls.  She states that this seemed to significantly exacerbate her cough.  She has COPD and chronic smoking but does not regularly use inhalers.  She states that she has felt increasingly short of breath since then.  She has had a cough with wheezing.  She has had some mild shortness of breath.  Denies any sputum production.  She was well prior to this.  She does not regularly use any inhalers as she was unable to afford them because she lost insurance.  No leg swelling.  No chest pain.        Past Medical History:  Diagnosis Date  . COPD (chronic obstructive pulmonary disease) (HCC)   . Emphysema of lung (HCC)     There are no problems to display for this patient.   Past Surgical History:  Procedure Laterality Date  . ABDOMINAL HYSTERECTOMY    . BLADDER SURGERY    . CHOLECYSTECTOMY      Prior to Admission medications   Medication Sig Start Date End Date Taking? Authorizing Provider  albuterol (PROVENTIL) (2.5 MG/3ML) 0.083% nebulizer solution Take 3 mLs (2.5 mg total) by nebulization every 6 (six) hours as needed for wheezing or shortness of breath. 08/18/20 08/18/21 Yes Shaune Pollack, MD  predniSONE (DELTASONE) 20 MG tablet Take 3 tablets (60 mg total) by mouth daily. 08/18/20 10/17/20 Yes Shaune Pollack, MD  cyclobenzaprine (FLEXERIL) 10 MG tablet Take 1 tablet (10 mg total) by mouth at bedtime. 03/04/15    Payton Mccallum, MD  doxycycline (VIBRAMYCIN) 100 MG capsule Take 1 capsule (100 mg total) by mouth 2 (two) times daily. 06/23/18   Lutricia Feil, PA-C  ibuprofen (ADVIL,MOTRIN) 600 MG tablet Take 1 tablet (600 mg total) by mouth every 8 (eight) hours as needed for mild pain or moderate pain. 01/09/15   Renford Dills, NP  metroNIDAZOLE (FLAGYL) 500 MG tablet Take 1 tablet (500 mg total) by mouth 2 (two) times daily. 06/23/18   Lutricia Feil, PA-C  oxyCODONE (ROXICODONE) 15 MG immediate release tablet Take 1 tablet (15 mg total) by mouth every 8 (eight) hours as needed for pain. 03/04/15   Payton Mccallum, MD    Allergies Patient has no known allergies.  Family History  Problem Relation Age of Onset  . Melanoma Mother   . Cancer Father     Social History Social History   Tobacco Use  . Smoking status: Current Every Day Smoker    Packs/day: 1.00    Types: Cigarettes  . Smokeless tobacco: Never Used  Vaping Use  . Vaping Use: Former  Substance Use Topics  . Alcohol use: Yes    Comment: occasional  . Drug use: Yes    Types: Cocaine, Marijuana    Comment: last use last week for cocaine    Review of Systems  Review of Systems  Constitutional: Negative for chills and fever.  HENT: Negative for sore throat.  Respiratory: Positive for cough, shortness of breath and wheezing.   Cardiovascular: Negative for chest pain.  Gastrointestinal: Negative for abdominal pain.  Genitourinary: Negative for flank pain.  Musculoskeletal: Negative for neck pain.  Skin: Negative for rash and wound.  Allergic/Immunologic: Negative for immunocompromised state.  Neurological: Negative for weakness and numbness.  Hematological: Does not bruise/bleed easily.     ____________________________________________  PHYSICAL EXAM:      VITAL SIGNS: ED Triage Vitals  Enc Vitals Group     BP 08/18/20 0936 120/78     Pulse Rate 08/18/20 0936 78     Resp 08/18/20 0936 19     Temp 08/18/20 0936 98  F (36.7 C)     Temp src --      SpO2 08/18/20 0936 97 %     Weight --      Height --      Head Circumference --      Peak Flow --      Pain Score 08/18/20 0933 0     Pain Loc --      Pain Edu? --      Excl. in GC? --      Physical Exam Vitals and nursing note reviewed.  Constitutional:      General: She is not in acute distress.    Appearance: She is well-developed.  HENT:     Head: Normocephalic and atraumatic.  Eyes:     Conjunctiva/sclera: Conjunctivae normal.  Cardiovascular:     Rate and Rhythm: Normal rate and regular rhythm.     Heart sounds: Normal heart sounds. No murmur heard. No friction rub.  Pulmonary:     Effort: Pulmonary effort is normal. No respiratory distress.     Breath sounds: Examination of the right-middle field reveals wheezing. Examination of the left-middle field reveals wheezing. Examination of the right-lower field reveals wheezing. Examination of the left-lower field reveals wheezing. Wheezing present. No rales.  Abdominal:     General: There is no distension.     Palpations: Abdomen is soft.     Tenderness: There is no abdominal tenderness.  Musculoskeletal:     Cervical back: Neck supple.  Skin:    General: Skin is warm.     Capillary Refill: Capillary refill takes less than 2 seconds.  Neurological:     Mental Status: She is alert and oriented to person, place, and time.     Motor: No abnormal muscle tone.       ____________________________________________   LABS (all labs ordered are listed, but only abnormal results are displayed)  Labs Reviewed  CBC  BASIC METABOLIC PANEL    ____________________________________________  EKG: Normal sinus rhythm, ventricular rate 71.  PR 141, QRS 98, QTc 475.  No acute ST elevations or depression.  No acute evidence of acute ischemia or infarct. ________________________________________  RADIOLOGY All imaging, including plain films, CT scans, and ultrasounds, independently reviewed by  me, and interpretations confirmed via formal radiology reads.  ED MD interpretation:   Chest x-ray: No acute disease  Official radiology report(s): DG Chest 2 View  Result Date: 08/18/2020 CLINICAL DATA:  Shortness of breath EXAM: CHEST - 2 VIEW COMPARISON:  None. FINDINGS: The heart size and mediastinal contours are within normal limits. Both lungs are clear. The visualized skeletal structures are unremarkable. IMPRESSION: No active cardiopulmonary disease. Electronically Signed   By: Marnee Spring M.D.   On: 08/18/2020 10:26    ____________________________________________  PROCEDURES   Procedure(s) performed (including Critical Care):  Procedures  ____________________________________________  INITIAL IMPRESSION / MDM / ASSESSMENT AND PLAN / ED COURSE  As part of my medical decision making, I reviewed the following data within the electronic MEDICAL RECORD NUMBER Nursing notes reviewed and incorporated, Old chart reviewed, Notes from prior ED visits, and [place] Controlled Substance Database       *Phyllis Edwards was evaluated in Emergency Department on 08/18/2020 for the symptoms described in the history of present illness. She was evaluated in the context of the global COVID-19 pandemic, which necessitated consideration that the patient might be at risk for infection with the SARS-CoV-2 virus that causes COVID-19. Institutional protocols and algorithms that pertain to the evaluation of patients at risk for COVID-19 are in a state of rapid change based on information released by regulatory bodies including the CDC and federal and state organizations. These policies and algorithms were followed during the patient's care in the ED.  Some ED evaluations and interventions may be delayed as a result of limited staffing during the pandemic.*     Medical Decision Making: 50 year old female here with shortness of breath in the setting of exposure to bleach in the house.  Suspect acute COPD  exacerbation with possible underlying reactive airway component.  Patient is afebrile and nontoxic.  She is satting well on room air.  Chest x-ray reviewed by me and is clear.  EKG nonischemic.  No signs of CHF or alternative etiology.  No evidence of PE.  Patient given breathing treatments, steroids, with improvement in symptoms.  Will discharge with outpatient prednisone, albuterol inhaler as well as nebs at home.  Return precautions given.  ____________________________________________  FINAL CLINICAL IMPRESSION(S) / ED DIAGNOSES  Final diagnoses:  Shortness of breath  COPD exacerbation (HCC)     MEDICATIONS GIVEN DURING THIS VISIT:  Medications  predniSONE (DELTASONE) tablet 60 mg (60 mg Oral Given 08/18/20 1017)  ipratropium-albuterol (DUONEB) 0.5-2.5 (3) MG/3ML nebulizer solution 3 mL (3 mLs Nebulization Given 08/18/20 1019)  ipratropium-albuterol (DUONEB) 0.5-2.5 (3) MG/3ML nebulizer solution 3 mL (3 mLs Nebulization Given 08/18/20 1019)  ipratropium-albuterol (DUONEB) 0.5-2.5 (3) MG/3ML nebulizer solution 3 mL (3 mLs Nebulization Given 08/18/20 1018)  albuterol (VENTOLIN HFA) 108 (90 Base) MCG/ACT inhaler 4 puff (4 puffs Inhalation Given 08/18/20 1331)     ED Discharge Orders         Ordered    predniSONE (DELTASONE) 20 MG tablet  Daily        08/18/20 1309    albuterol (PROVENTIL) (2.5 MG/3ML) 0.083% nebulizer solution  Every 6 hours PRN        08/18/20 1309           Note:  This document was prepared using Dragon voice recognition software and may include unintentional dictation errors.   Shaune Pollack, MD 08/18/20 1340

## 2021-03-01 ENCOUNTER — Emergency Department: Payer: Medicaid Other

## 2021-03-01 ENCOUNTER — Emergency Department
Admission: EM | Admit: 2021-03-01 | Discharge: 2021-03-01 | Disposition: A | Discharge status: Home / Self Care | Payer: Medicaid Other | Attending: Emergency Medicine | Admitting: Emergency Medicine

## 2021-03-01 ENCOUNTER — Other Ambulatory Visit: Payer: Self-pay

## 2021-03-01 DIAGNOSIS — B349 Viral infection, unspecified: Secondary | ICD-10-CM | POA: Insufficient documentation

## 2021-03-01 DIAGNOSIS — J449 Chronic obstructive pulmonary disease, unspecified: Secondary | ICD-10-CM | POA: Insufficient documentation

## 2021-03-01 DIAGNOSIS — Z20822 Contact with and (suspected) exposure to covid-19: Secondary | ICD-10-CM | POA: Insufficient documentation

## 2021-03-01 DIAGNOSIS — Z79899 Other long term (current) drug therapy: Secondary | ICD-10-CM | POA: Insufficient documentation

## 2021-03-01 DIAGNOSIS — F17210 Nicotine dependence, cigarettes, uncomplicated: Secondary | ICD-10-CM | POA: Insufficient documentation

## 2021-03-01 LAB — RESP PANEL BY RT-PCR (FLU A&B, COVID) ARPGX2
Influenza A by PCR: NEGATIVE
Influenza B by PCR: NEGATIVE
SARS Coronavirus 2 by RT PCR: NEGATIVE

## 2021-03-01 MED ORDER — PREDNISONE 50 MG PO TABS: 50.0000 mg | ORAL_TABLET | Freq: Every day | ORAL | 0 refills | Status: DC

## 2021-03-01 MED ORDER — PREDNISONE 20 MG PO TABS
60.0000 mg | ORAL_TABLET | Freq: Once | ORAL | Status: AC
  Administered 2021-03-01: 60 mg via ORAL
  Filled 2021-03-01: qty 3

## 2021-03-01 NOTE — ED Provider Notes (Signed)
[facility] Emergency Department Provider Note  ____________________________________________  Time seen: Approximately 9:33 PM  I have reviewed the triage vital signs and the nursing notes.   HISTORY  Chief Complaint Cough and Fever    HPI Phyllis Edwards is a 50 y.o. female who presents emergency department nasal congestion, body aches, cough x3 days.  No reported fevers but patient has been having chills.  Patient does have a history of COPD, uses albuterol as needed.  She states that she feels "like I am sick" but is not having any kind of difficulty breathing.  Patient states that her chest feels tight but she is not having any substernal pain.  No GI complaints.  No urinary symptoms.  Patient does have a history of COPD and emphysema.       Past Medical History:  Diagnosis Date   COPD (chronic obstructive pulmonary disease) (HCC)    Emphysema of lung (HCC)     There are no problems to display for this patient.   Past Surgical History:  Procedure Laterality Date   ABDOMINAL HYSTERECTOMY     BLADDER SURGERY     CHOLECYSTECTOMY      Prior to Admission medications   Medication Sig Start Date End Date Taking? Authorizing Provider  predniSONE (DELTASONE) 50 MG tablet Take 1 tablet (50 mg total) by mouth daily with breakfast. 03/01/21  Yes Wah Sabic, Delorise Royals, PA-C  albuterol (PROVENTIL) (2.5 MG/3ML) 0.083% nebulizer solution Take 3 mLs (2.5 mg total) by nebulization every 6 (six) hours as needed for wheezing or shortness of breath. 08/18/20 08/18/21  Shaune Pollack, MD  cyclobenzaprine (FLEXERIL) 10 MG tablet Take 1 tablet (10 mg total) by mouth at bedtime. 03/04/15   Payton Mccallum, MD  doxycycline (VIBRAMYCIN) 100 MG capsule Take 1 capsule (100 mg total) by mouth 2 (two) times daily. 06/23/18   Lutricia Feil, PA-C  ibuprofen (ADVIL,MOTRIN) 600 MG tablet Take 1 tablet (600 mg total) by mouth every 8 (eight) hours as needed for mild pain or moderate  pain. 01/09/15   Renford Dills, NP  metroNIDAZOLE (FLAGYL) 500 MG tablet Take 1 tablet (500 mg total) by mouth 2 (two) times daily. 06/23/18   Lutricia Feil, PA-C  oxyCODONE (ROXICODONE) 15 MG immediate release tablet Take 1 tablet (15 mg total) by mouth every 8 (eight) hours as needed for pain. 03/04/15   Payton Mccallum, MD    Allergies Patient has no known allergies.  Family History  Problem Relation Age of Onset   Melanoma Mother    Cancer Father     Social History Social History   Tobacco Use   Smoking status: Every Day    Packs/day: 1.00    Types: Cigarettes   Smokeless tobacco: Never  Vaping Use   Vaping Use: Former  Substance Use Topics   Alcohol use: Yes    Comment: occasional   Drug use: Yes    Types: Cocaine, Marijuana    Comment: last use last week for cocaine     Review of Systems  Constitutional: No fever but positive for chills Eyes: No visual changes. No discharge ENT: Positive for nasal congestion Cardiovascular: no chest pain. Respiratory: Positive cough. No SOB. Gastrointestinal: No abdominal pain.  No nausea, no vomiting.  No diarrhea.  No constipation. Genitourinary: Negative for dysuria. No hematuria Musculoskeletal: Negative for musculoskeletal pain. Skin: Negative for rash, abrasions, lacerations, ecchymosis. Neurological: Negative for headaches, focal weakness or numbness.  10 System ROS otherwise negative.  ____________________________________________  PHYSICAL EXAM:  VITAL SIGNS: ED Triage Vitals [03/01/21 2105]  Enc Vitals Group     BP (!) 138/59     Pulse Rate 80     Resp 20     Temp 98.2 F (36.8 C)     Temp Source Oral     SpO2 95 %     Weight 145 lb 1 oz (65.8 kg)     Height 5\' 4"  (1.626 m)     Head Circumference      Peak Flow      Pain Score 7     Pain Loc      Pain Edu?      Excl. in GC?      Constitutional: Alert and oriented. Well appearing and in no acute distress. Eyes: Conjunctivae are normal. PERRL.  EOMI. Head: Atraumatic. ENT:      Ears:       Nose: No congestion/rhinnorhea.      Mouth/Throat: Mucous membranes are moist.  Neck: No stridor.  Neck is supple full range of motion.  No tenderness. Hematological/Lymphatic/Immunilogical: No cervical lymphadenopathy. Cardiovascular: Normal rate, regular rhythm. Normal S1 and S2.  Good peripheral circulation. Respiratory: Normal respiratory effort without tachypnea or retractions. Lungs CTAB. Good air entry to the bases with no decreased or absent breath sounds. Gastrointestinal: Bowel sounds 4 quadrants. Soft and nontender to palpation. No guarding or rigidity. No palpable masses. No distention. No CVA tenderness. Musculoskeletal: Full range of motion to all extremities. No gross deformities appreciated. Neurologic:  Normal speech and language. No gross focal neurologic deficits are appreciated.  Skin:  Skin is warm, dry and intact. No rash noted. Psychiatric: Mood and affect are normal. Speech and behavior are normal. Patient exhibits appropriate insight and judgement.   ____________________________________________   LABS (all labs ordered are listed, but only abnormal results are displayed)  Labs Reviewed  RESP PANEL BY RT-PCR (FLU A&B, COVID) ARPGX2   ____________________________________________  EKG   ____________________________________________  RADIOLOGY I personally viewed and evaluated these images as part of my medical decision making, as well as reviewing the written report by the radiologist.  ED Provider Interpretation: No evidence of consolidation concerning for pneumonia  DG Chest 2 View  Result Date: 03/01/2021 CLINICAL DATA:  Shortness of breath and cough for several days EXAM: CHEST - 2 VIEW COMPARISON:  08/18/2020 FINDINGS: The heart size and mediastinal contours are within normal limits. Both lungs are clear. The visualized skeletal structures are unremarkable. IMPRESSION: No active cardiopulmonary disease.  Electronically Signed   By: 10/18/2020 M.D.   On: 03/01/2021 21:38    ____________________________________________    PROCEDURES  Procedure(s) performed:    Procedures    Medications  predniSONE (DELTASONE) tablet 60 mg (has no administration in time range)     ____________________________________________   INITIAL IMPRESSION / ASSESSMENT AND PLAN / ED COURSE  Pertinent labs & imaging results that were available during my care of the patient were reviewed by me and considered in my medical decision making (see chart for details).  Review of the  [place] was performed in accordance of the [place] prior to dispensing any controlled drugs.           Patient's diagnosis is consistent with viral illness.  Patient presents with body aches, chills, congestion and cough x3 days.  Negative for influenza and COVID on testing.  Chest x-ray revealed no evidence of pneumonia.  Patient will be treated with prednisone at home, she will use Mucinex for additional  symptom control.  Albuterol inhaler as needed.  Return precautions discussed with the patient.  Patient will follow up with primary care as needed..  Patient is given ED precautions to return to the ED for any worsening or new symptoms.     ____________________________________________  FINAL CLINICAL IMPRESSION(S) / ED DIAGNOSES  Final diagnoses:  Viral illness      NEW MEDICATIONS STARTED DURING THIS VISIT:  ED Discharge Orders          Ordered    predniSONE (DELTASONE) 50 MG tablet  Daily with breakfast        03/01/21 2228                This chart was dictated using voice recognition software/Dragon. Despite best efforts to proofread, errors can occur which can change the meaning. Any change was purely unintentional.    Darletta Moll, PA-C 03/01/21 2229    Vladimir Crofts, MD 03/01/21 [phone]

## 2021-03-01 NOTE — ED Triage Notes (Signed)
Pt presents to ER c/o 3 days of flu-like symptoms.  Pt states she has been coughing, had chest tightness, body aches, and chills.  Pt A&O x4 at this time.  Unknown if pt has been around anyone sick at home.

## 2021-12-01 DIAGNOSIS — R519 Headache, unspecified: Secondary | ICD-10-CM | POA: Diagnosis not present

## 2021-12-01 DIAGNOSIS — R69 Illness, unspecified: Secondary | ICD-10-CM | POA: Diagnosis not present

## 2021-12-01 DIAGNOSIS — Z20822 Contact with and (suspected) exposure to covid-19: Secondary | ICD-10-CM | POA: Diagnosis not present

## 2021-12-01 DIAGNOSIS — R112 Nausea with vomiting, unspecified: Secondary | ICD-10-CM | POA: Diagnosis not present

## 2021-12-01 DIAGNOSIS — R0789 Other chest pain: Secondary | ICD-10-CM | POA: Diagnosis not present

## 2021-12-01 DIAGNOSIS — R079 Chest pain, unspecified: Secondary | ICD-10-CM | POA: Diagnosis not present

## 2021-12-03 DIAGNOSIS — R079 Chest pain, unspecified: Secondary | ICD-10-CM | POA: Diagnosis not present

## 2022-01-22 ENCOUNTER — Emergency Department: Payer: Self-pay

## 2022-01-22 ENCOUNTER — Emergency Department
Admission: EM | Admit: 2022-01-22 | Discharge: 2022-01-22 | Disposition: A | Discharge status: Home / Self Care | Payer: Self-pay | Attending: Emergency Medicine | Admitting: Emergency Medicine

## 2022-01-22 ENCOUNTER — Other Ambulatory Visit: Payer: Self-pay

## 2022-01-22 DIAGNOSIS — J111 Influenza due to unidentified influenza virus with other respiratory manifestations: Secondary | ICD-10-CM | POA: Insufficient documentation

## 2022-01-22 DIAGNOSIS — N390 Urinary tract infection, site not specified: Secondary | ICD-10-CM | POA: Insufficient documentation

## 2022-01-22 DIAGNOSIS — Z20822 Contact with and (suspected) exposure to covid-19: Secondary | ICD-10-CM | POA: Insufficient documentation

## 2022-01-22 LAB — URINALYSIS, ROUTINE W REFLEX MICROSCOPIC
Bilirubin Urine: NEGATIVE
Glucose, UA: NEGATIVE mg/dL
Hgb urine dipstick: NEGATIVE
Ketones, ur: NEGATIVE mg/dL
Nitrite: NEGATIVE
Protein, ur: NEGATIVE mg/dL
Specific Gravity, Urine: 1.018 (ref 1.005–1.030)
WBC, UA: >50 WBC/hpf — ABNORMAL HIGH (ref 0–5)
pH: 5 (ref 5.0–8.0)

## 2022-01-22 LAB — COMPREHENSIVE METABOLIC PANEL WITH GFR
ALT: 19 U/L (ref 0–44)
AST: 23 U/L (ref 15–41)
Albumin: 3.4 g/dL — ABNORMAL LOW (ref 3.5–5.0)
Alkaline Phosphatase: 77 U/L (ref 38–126)
Anion gap: 5 (ref 5–15)
BUN: 10 mg/dL (ref 6–20)
CO2: 26 mmol/L (ref 22–32)
Calcium: 8.4 mg/dL — ABNORMAL LOW (ref 8.9–10.3)
Chloride: 110 mmol/L (ref 98–111)
Creatinine, Ser: 0.62 mg/dL (ref 0.44–1.00)
GFR, Estimated: >60 mL/min
Glucose, Bld: 117 mg/dL — ABNORMAL HIGH (ref 70–99)
Potassium: 3.5 mmol/L (ref 3.5–5.1)
Sodium: 141 mmol/L (ref 135–145)
Total Bilirubin: 0.5 mg/dL (ref 0.3–1.2)
Total Protein: 6 g/dL — ABNORMAL LOW (ref 6.5–8.1)

## 2022-01-22 LAB — CBC WITH DIFFERENTIAL/PLATELET
Abs Immature Granulocytes: 0.02 10*3/uL (ref 0.00–0.07)
Basophils Absolute: 0.1 10*3/uL (ref 0.0–0.1)
Basophils Relative: 1 %
Eosinophils Absolute: 0.2 10*3/uL (ref 0.0–0.5)
Eosinophils Relative: 3 %
HCT: 40 % (ref 36.0–46.0)
Hemoglobin: 13 g/dL (ref 12.0–15.0)
Immature Granulocytes: 0 %
Lymphocytes Relative: 30 %
Lymphs Abs: 2.2 10*3/uL (ref 0.7–4.0)
MCH: 31.9 pg (ref 26.0–34.0)
MCHC: 32.5 g/dL (ref 30.0–36.0)
MCV: 98 fL (ref 80.0–100.0)
Monocytes Absolute: 0.5 10*3/uL (ref 0.1–1.0)
Monocytes Relative: 7 %
Neutro Abs: 4.4 10*3/uL (ref 1.7–7.7)
Neutrophils Relative %: 59 %
Platelets: 234 10*3/uL (ref 150–400)
RBC: 4.08 MIL/uL (ref 3.87–5.11)
RDW: 13.7 % (ref 11.5–15.5)
WBC: 7.3 10*3/uL (ref 4.0–10.5)
nRBC: 0 % (ref 0.0–0.2)

## 2022-01-22 LAB — RESP PANEL BY RT-PCR (FLU A&B, COVID) ARPGX2
Influenza A by PCR: NEGATIVE
Influenza B by PCR: NEGATIVE
SARS Coronavirus 2 by RT PCR: NEGATIVE

## 2022-01-22 LAB — GROUP A STREP BY PCR: Group A Strep by PCR: NOT DETECTED

## 2022-01-22 MED ORDER — CEPHALEXIN 500 MG PO CAPS: 500.0000 mg | ORAL_CAPSULE | Freq: Three times a day (TID) | ORAL | 0 refills | Status: AC

## 2022-01-22 MED ORDER — SODIUM CHLORIDE 0.9 % IV SOLN
1.0000 g | Freq: Once | INTRAVENOUS | Status: AC
  Administered 2022-01-22: 1 g via INTRAVENOUS
  Filled 2022-01-22: qty 10

## 2022-01-22 MED ORDER — ACETAMINOPHEN 325 MG PO TABS
650.0000 mg | ORAL_TABLET | Freq: Once | ORAL | Status: AC
  Administered 2022-01-22: 650 mg via ORAL
  Filled 2022-01-22: qty 2

## 2022-01-22 NOTE — Discharge Instructions (Addendum)
You do have a urinary tract infection.  I have given you a dose of antibiotics here in the hospital and then some more to go home with.  Take the Keflex 1 pill 3 times a day.  You can use Tylenol 3 or 4 times a day if needed for the aches and pains.  It looks like you may also have a viral illness something like the flu although the flu test is negative.  Rest and take it easy as much as you can.  See if you can get to the shelter.  Please return if you feel worse at all or get a fever or vomiting or not feeling better in 2 or 3 days.

## 2022-01-22 NOTE — ED Triage Notes (Signed)
Pt arrives with c/o body aches, cough, and congestion that started 3 days ago.

## 2022-01-22 NOTE — ED Provider Notes (Signed)
[facility] Provider Note    Event Date/Time   First MD Initiated Contact with Patient 01/22/22 1133     (approximate)   History   URI   HPI  Briseis Bruno is a 51 y.o. female who reports 3 days of body aches cough and congestion.  She is not running a fever.  She just aches all over and feels horrible.  He says she is homeless.      Physical Exam   Triage Vital Signs: ED Triage Vitals  Enc Vitals Group     BP 01/22/22 1127 (!) 141/76     Pulse Rate 01/22/22 1127 70     Resp 01/22/22 1127 18     Temp 01/22/22 1127 98.5 F (36.9 C)     Temp Source 01/22/22 1127 Oral     SpO2 01/22/22 1127 97 %     Weight --      Height --      Head Circumference --      Peak Flow --      Pain Score 01/22/22 1100 8     Pain Loc --      Pain Edu? --      Excl. in GC? --     Most recent vital signs: Vitals:   01/22/22 1127  BP: (!) 141/76  Pulse: 70  Resp: 18  Temp: 98.5 F (36.9 C)  SpO2: 97%     General: Awake, complaining of aching all over CV:  Good peripheral perfusion.  Heart regular rate and rhythm no audible murmurs Resp:  Normal effort.  Lungs are clear Abd:  No distention.  Soft and nontender Extremities no edema  ED Results / Procedures / Treatments   Labs (all labs ordered are listed, but only abnormal results are displayed) Labs Reviewed  COMPREHENSIVE METABOLIC PANEL - Abnormal; Notable for the following components:      Result Value   Glucose, Bld 117 (*)    Calcium 8.4 (*)    Total Protein 6.0 (*)    Albumin 3.4 (*)    All other components within normal limits  URINALYSIS, ROUTINE W REFLEX MICROSCOPIC - Abnormal; Notable for the following components:   Color, Urine YELLOW (*)    APPearance CLOUDY (*)    Leukocytes,Ua MODERATE (*)    WBC, UA >50 (*)    Bacteria, UA RARE (*)    All other components within normal limits  RESP PANEL BY RT-PCR (FLU A&B, COVID) ARPGX2  GROUP A STREP BY PCR  CBC WITH DIFFERENTIAL/PLATELET      EKG     RADIOLOGY Chest x-ray AP and the lateral read by the radiology reviewed and interpreted by me shows no acute disease   PROCEDURES:  Critical Care performed:   Procedures   MEDICATIONS ORDERED IN ED: Medications  cefTRIAXone (ROCEPHIN) 1 g in sodium chloride 0.9 % 100 mL IVPB (has no administration in time range)  acetaminophen (TYLENOL) tablet 650 mg (has no administration in time range)     IMPRESSION / MDM / ASSESSMENT AND PLAN / ED COURSE  I reviewed the triage vital signs and the nursing notes.  Patient's electrolytes and GFR are okay her sugar slightly elevated.  White count is normal differential is normal flu and COVID are negative strep is negative I am waiting on the urinalysis at this point. Differential diagnosis includes, but is not limited to, with the lab work being as that is the differential right now includes a viral infection  of some sort but not COVID or flu.  Until the UA comes back a UTI is also a possibility. ----------------------------------------- 2:28 PM on 01/22/2022 ----------------------------------------- Patient's urinalysis comes back showing a likely UTI.  I will give her some Keflex to go home with and a dose of Rocephin here in the ER.  The rest of her lab work looks okay. Patient's presentation is most consistent with acute complicated illness / injury requiring diagnostic workup.     FINAL CLINICAL IMPRESSION(S) / ED DIAGNOSES   Final diagnoses:  Influenza-like illness  Urinary tract infection without hematuria, site unspecified     Rx / DC Orders   ED Discharge Orders     None        Note:  This document was prepared using Dragon voice recognition software and may include unintentional dictation errors.   Nena Polio, MD 01/22/22 [phone]

## 2022-03-26 DIAGNOSIS — R69 Illness, unspecified: Secondary | ICD-10-CM | POA: Diagnosis not present

## 2022-03-26 DIAGNOSIS — J439 Emphysema, unspecified: Secondary | ICD-10-CM | POA: Diagnosis not present

## 2022-03-26 DIAGNOSIS — R079 Chest pain, unspecified: Secondary | ICD-10-CM | POA: Diagnosis not present

## 2022-03-26 DIAGNOSIS — R0789 Other chest pain: Secondary | ICD-10-CM | POA: Diagnosis not present

## 2022-03-26 DIAGNOSIS — Z79899 Other long term (current) drug therapy: Secondary | ICD-10-CM | POA: Diagnosis not present

## 2022-03-27 DIAGNOSIS — R079 Chest pain, unspecified: Secondary | ICD-10-CM | POA: Diagnosis not present

## 2022-04-05 DIAGNOSIS — R059 Cough, unspecified: Secondary | ICD-10-CM | POA: Diagnosis not present

## 2022-04-05 DIAGNOSIS — R0602 Shortness of breath: Secondary | ICD-10-CM | POA: Diagnosis not present

## 2022-04-05 DIAGNOSIS — R0981 Nasal congestion: Secondary | ICD-10-CM | POA: Diagnosis not present

## 2022-04-05 DIAGNOSIS — J441 Chronic obstructive pulmonary disease with (acute) exacerbation: Secondary | ICD-10-CM | POA: Diagnosis not present

## 2022-04-07 DIAGNOSIS — R0602 Shortness of breath: Secondary | ICD-10-CM | POA: Diagnosis not present

## 2022-04-07 DIAGNOSIS — Z801 Family history of malignant neoplasm of trachea, bronchus and lung: Secondary | ICD-10-CM | POA: Diagnosis not present

## 2022-04-07 DIAGNOSIS — R079 Chest pain, unspecified: Secondary | ICD-10-CM | POA: Diagnosis not present

## 2022-04-07 DIAGNOSIS — R059 Cough, unspecified: Secondary | ICD-10-CM | POA: Diagnosis not present

## 2022-04-07 DIAGNOSIS — R69 Illness, unspecified: Secondary | ICD-10-CM | POA: Diagnosis not present

## 2022-04-07 DIAGNOSIS — Z8249 Family history of ischemic heart disease and other diseases of the circulatory system: Secondary | ICD-10-CM | POA: Diagnosis not present

## 2022-04-07 DIAGNOSIS — J441 Chronic obstructive pulmonary disease with (acute) exacerbation: Secondary | ICD-10-CM | POA: Diagnosis not present

## 2022-04-07 DIAGNOSIS — Z1152 Encounter for screening for COVID-19: Secondary | ICD-10-CM | POA: Diagnosis not present

## 2022-04-07 DIAGNOSIS — Z823 Family history of stroke: Secondary | ICD-10-CM | POA: Diagnosis not present

## 2022-04-20 ENCOUNTER — Emergency Department: Payer: Medicaid Other

## 2022-04-20 ENCOUNTER — Emergency Department
Admission: EM | Admit: 2022-04-20 | Discharge: 2022-04-20 | Disposition: A | Discharge status: Home / Self Care | Payer: Medicaid Other | Attending: Emergency Medicine | Admitting: Emergency Medicine

## 2022-04-20 DIAGNOSIS — D72829 Elevated white blood cell count, unspecified: Secondary | ICD-10-CM | POA: Insufficient documentation

## 2022-04-20 DIAGNOSIS — Z1152 Encounter for screening for COVID-19: Secondary | ICD-10-CM | POA: Diagnosis not present

## 2022-04-20 DIAGNOSIS — A084 Viral intestinal infection, unspecified: Secondary | ICD-10-CM | POA: Diagnosis not present

## 2022-04-20 DIAGNOSIS — R531 Weakness: Secondary | ICD-10-CM | POA: Diagnosis not present

## 2022-04-20 DIAGNOSIS — R112 Nausea with vomiting, unspecified: Secondary | ICD-10-CM | POA: Diagnosis present

## 2022-04-20 DIAGNOSIS — I959 Hypotension, unspecified: Secondary | ICD-10-CM | POA: Diagnosis not present

## 2022-04-20 LAB — CBC
HCT: 38.8 % (ref 36.0–46.0)
Hemoglobin: 12.8 g/dL (ref 12.0–15.0)
MCH: 31.3 pg (ref 26.0–34.0)
MCHC: 33 g/dL (ref 30.0–36.0)
MCV: 94.9 fL (ref 80.0–100.0)
Platelets: 196 10*3/uL (ref 150–400)
RBC: 4.09 MIL/uL (ref 3.87–5.11)
RDW: 13.3 % (ref 11.5–15.5)
WBC: 14.7 10*3/uL — ABNORMAL HIGH (ref 4.0–10.5)
nRBC: 0 % (ref 0.0–0.2)

## 2022-04-20 LAB — URINALYSIS, ROUTINE W REFLEX MICROSCOPIC
Bilirubin Urine: NEGATIVE
Glucose, UA: NEGATIVE mg/dL
Hgb urine dipstick: NEGATIVE
Ketones, ur: NEGATIVE mg/dL
Leukocytes,Ua: NEGATIVE
Nitrite: NEGATIVE
Protein, ur: NEGATIVE mg/dL
Specific Gravity, Urine: 1.012 (ref 1.005–1.030)
pH: 5 (ref 5.0–8.0)

## 2022-04-20 LAB — COMPREHENSIVE METABOLIC PANEL
ALT: 24 U/L (ref 0–44)
AST: 18 U/L (ref 15–41)
Albumin: 3.7 g/dL (ref 3.5–5.0)
Alkaline Phosphatase: 58 U/L (ref 38–126)
Anion gap: 9 (ref 5–15)
BUN: 13 mg/dL (ref 6–20)
CO2: 20 mmol/L — ABNORMAL LOW (ref 22–32)
Calcium: 8.4 mg/dL — ABNORMAL LOW (ref 8.9–10.3)
Chloride: 104 mmol/L (ref 98–111)
Creatinine, Ser: 0.54 mg/dL (ref 0.44–1.00)
GFR, Estimated: >60 mL/min (ref >60)
Glucose, Bld: 111 mg/dL — ABNORMAL HIGH (ref 70–99)
Potassium: 3.8 mmol/L (ref 3.5–5.1)
Sodium: 133 mmol/L — ABNORMAL LOW (ref 135–145)
Total Bilirubin: 0.8 mg/dL (ref 0.3–1.2)
Total Protein: 6.4 g/dL — ABNORMAL LOW (ref 6.5–8.1)

## 2022-04-20 LAB — RESP PANEL BY RT-PCR (RSV, FLU A&B, COVID)  RVPGX2
Influenza A by PCR: NEGATIVE
Influenza B by PCR: NEGATIVE
Resp Syncytial Virus by PCR: NEGATIVE
SARS Coronavirus 2 by RT PCR: NEGATIVE

## 2022-04-20 LAB — LIPASE, BLOOD: Lipase: 27 U/L (ref 11–51)

## 2022-04-20 MED ORDER — ONDANSETRON HCL 4 MG/2ML IJ SOLN
4.0000 mg | Freq: Once | INTRAMUSCULAR | Status: AC
  Administered 2022-04-20: 4 mg via INTRAVENOUS
  Filled 2022-04-20: qty 2

## 2022-04-20 MED ORDER — ONDANSETRON 4 MG PO TBDP: 4.0000 mg | ORAL_TABLET | Freq: Three times a day (TID) | ORAL | 0 refills | Status: DC | PRN

## 2022-04-20 MED ORDER — SODIUM CHLORIDE 0.9 % IV SOLN: Freq: Once | INTRAVENOUS | Status: AC

## 2022-04-20 NOTE — ED Provider Notes (Signed)
[facility] Provider Note    Event Date/Time   First MD Initiated Contact with Patient 04/20/22 2050     (approximate)   History   Abdominal Pain   HPI  Phyllis Edwards is a 52 y.o. female who presents with complaints of nausea vomiting diarrhea, she reports it has been going on for about 24 hours, she describes diffuse abdominal cramping as well.     Physical Exam   Triage Vital Signs: ED Triage Vitals [04/20/22 1853]  Enc Vitals Group     BP 116/77     Pulse Rate 93     Resp 18     Temp 98.8 F (37.1 C)     Temp Source Oral     SpO2 97 %     Weight 61.7 kg (136 lb)     Height      Head Circumference      Peak Flow      Pain Score 6     Pain Loc      Pain Edu?      Excl. in [place]?     Most recent vital signs: Vitals:   04/20/22 1853  BP: 116/77  Pulse: 93  Resp: 18  Temp: 98.8 F (37.1 C)  SpO2: 97%     General: Awake, no distress.  CV:  Good peripheral perfusion.  Resp:  Normal effort.  Abd:  No distention.  Soft, nontender Other:     ED Results / Procedures / Treatments   Labs (all labs ordered are listed, but only abnormal results are displayed) Labs Reviewed  COMPREHENSIVE METABOLIC PANEL - Abnormal; Notable for the following components:      Result Value   Sodium 133 (*)    CO2 20 (*)    Glucose, Bld 111 (*)    Calcium 8.4 (*)    Total Protein 6.4 (*)    All other components within normal limits  CBC - Abnormal; Notable for the following components:   WBC 14.7 (*)    All other components within normal limits  URINALYSIS, ROUTINE W REFLEX MICROSCOPIC - Abnormal; Notable for the following components:   Color, Urine YELLOW (*)    APPearance CLEAR (*)    All other components within normal limits  RESP PANEL BY RT-PCR (RSV, FLU A&B, COVID)  RVPGX2  LIPASE, BLOOD     EKG     RADIOLOGY  Chest x-ray viewed interpret by me, no acute abnormality   PROCEDURES:  Critical Care performed:    Procedures   MEDICATIONS ORDERED IN ED: Medications  0.9 %  sodium chloride infusion (0 mLs Intravenous Stopped 04/20/22 2220)  ondansetron (ZOFRAN) injection 4 mg (4 mg Intravenous Given 04/20/22 2121)     IMPRESSION / MDM / ASSESSMENT AND PLAN / ED COURSE  I reviewed the triage vital signs and the nursing notes. Patient's presentation is most consistent with acute complicated illness / injury requiring diagnostic workup.   Patient presents with nausea vomiting diarrhea as above, suspicious for viral illness.  Likely norovirus which is prevalent in the community this time.  Lab work reviewed, elevated white blood cell count consistent with viral gastroenteritis, no evidence of bacterial infection, will treat with IV fluids, IV Zofran  Considered admission however no indication given improvement after symptoms, reassuring workup, appropriate for discharge at this time.       FINAL CLINICAL IMPRESSION(S) / ED DIAGNOSES   Final diagnoses:  Viral gastroenteritis     Rx / DC  Orders   ED Discharge Orders          Ordered    ondansetron (ZOFRAN-ODT) 4 MG disintegrating tablet  Every 8 hours PRN        04/20/22 2138             Note:  This document was prepared using Dragon voice recognition software and may include unintentional dictation errors.   Lavonia Drafts, MD 04/20/22 [phone]

## 2022-04-20 NOTE — ED Notes (Signed)
Pt to ED via [facility] [place] the side [place] the road for generalized weakness. 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 Pt has cough, HA, and fever. Pt states that she wants a CT scan to check for lung cancer. Pt is in NAD.

## 2022-04-20 NOTE — ED Notes (Signed)
Signing pad not available. Pt given some soda and cereal to go with her.

## 2022-04-20 NOTE — ED Triage Notes (Signed)
Pt sts that she has been having N/V with abd pain and weakness and a headache. Pt sts that she has been like this for 24 hrs.

## 2022-06-24 ENCOUNTER — Other Ambulatory Visit: Payer: Self-pay

## 2022-06-24 ENCOUNTER — Emergency Department
Admission: EM | Admit: 2022-06-24 | Discharge: 2022-06-24 | Disposition: A | Discharge status: Home / Self Care | Payer: Medicaid Other | Attending: Student in an Organized Health Care Education/Training Program | Admitting: Student in an Organized Health Care Education/Training Program

## 2022-06-24 ENCOUNTER — Emergency Department: Payer: Medicaid Other

## 2022-06-24 DIAGNOSIS — M940 Chondrocostal junction syndrome [Tietze]: Secondary | ICD-10-CM | POA: Diagnosis not present

## 2022-06-24 DIAGNOSIS — J449 Chronic obstructive pulmonary disease, unspecified: Secondary | ICD-10-CM | POA: Diagnosis not present

## 2022-06-24 DIAGNOSIS — R0789 Other chest pain: Secondary | ICD-10-CM | POA: Diagnosis present

## 2022-06-24 LAB — BASIC METABOLIC PANEL
Anion gap: 6 (ref 5–15)
BUN: 20 mg/dL (ref 6–20)
CO2: 25 mmol/L (ref 22–32)
Calcium: 8.6 mg/dL — ABNORMAL LOW (ref 8.9–10.3)
Chloride: 107 mmol/L (ref 98–111)
Creatinine, Ser: 0.55 mg/dL (ref 0.44–1.00)
GFR, Estimated: >60 mL/min (ref >60)
Glucose, Bld: 91 mg/dL (ref 70–99)
Potassium: 3.7 mmol/L (ref 3.5–5.1)
Sodium: 138 mmol/L (ref 135–145)

## 2022-06-24 LAB — CBC
HCT: 42.7 % (ref 36.0–46.0)
Hemoglobin: 12.9 g/dL (ref 12.0–15.0)
MCH: 31.3 pg (ref 26.0–34.0)
MCHC: 30.2 g/dL (ref 30.0–36.0)
MCV: 103.6 fL — ABNORMAL HIGH (ref 80.0–100.0)
Platelets: 273 10*3/uL (ref 150–400)
RBC: 4.12 MIL/uL (ref 3.87–5.11)
RDW: 13.3 % (ref 11.5–15.5)
WBC: 9.7 10*3/uL (ref 4.0–10.5)
nRBC: 0 % (ref 0.0–0.2)

## 2022-06-24 LAB — TROPONIN I (HIGH SENSITIVITY)
Troponin I (High Sensitivity): <2 ng/L (ref <18)
Troponin I (High Sensitivity): <2 ng/L (ref <18)

## 2022-06-24 MED ORDER — KETOROLAC TROMETHAMINE 30 MG/ML IJ SOLN
30.0000 mg | Freq: Once | INTRAMUSCULAR | Status: AC
  Administered 2022-06-24: 30 mg via INTRAMUSCULAR
  Filled 2022-06-24: qty 1

## 2022-06-24 MED ORDER — ALBUTEROL SULFATE (2.5 MG/3ML) 0.083% IN NEBU: 2.5000 mg | INHALATION_SOLUTION | RESPIRATORY_TRACT | 2 refills | Status: DC | PRN

## 2022-06-24 MED ORDER — MELOXICAM 15 MG PO TABS: 15.0000 mg | ORAL_TABLET | Freq: Every day | ORAL | 0 refills | Status: AC

## 2022-06-24 MED ORDER — DEXAMETHASONE SODIUM PHOSPHATE 10 MG/ML IJ SOLN
10.0000 mg | Freq: Once | INTRAMUSCULAR | Status: AC
  Administered 2022-06-24: 10 mg via INTRAMUSCULAR
  Filled 2022-06-24: qty 1

## 2022-06-24 MED ORDER — PREDNISONE 50 MG PO TABS: 50.0000 mg | ORAL_TABLET | Freq: Every day | ORAL | 0 refills | Status: DC

## 2022-06-24 NOTE — ED Triage Notes (Signed)
Pt to ED via POV from home. Pt reports left sided breast/CP that radiates to the left back and shoulder. Pt also reports SOB. Pt with hx COPD and emphysema and lung nodules. Pt currently smokes. Pt also due to insurance issues has not had inhaler or nebulizer.
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
## 2023-12-28 ENCOUNTER — Ambulatory Visit: Payer: MEDICAID

## 2023-12-28 DIAGNOSIS — Z113 Encounter for screening for infections with a predominantly sexual mode of transmission: Secondary | ICD-10-CM | POA: Diagnosis not present

## 2023-12-28 DIAGNOSIS — Z202 Contact with and (suspected) exposure to infections with a predominantly sexual mode of transmission: Secondary | ICD-10-CM

## 2023-12-28 LAB — HM HIV SCREENING LAB: HM HIV Screening: NEGATIVE

## 2023-12-28 LAB — HM HEPATITIS C SCREENING LAB: HM Hepatitis Screen: NEGATIVE

## 2023-12-28 MED ORDER — CEFTRIAXONE SODIUM 500 MG IJ SOLR
500.0000 mg | Freq: Once | INTRAMUSCULAR | Status: AC
  Administered 2023-12-28: 500 mg via INTRAMUSCULAR

## 2023-12-28 NOTE — Progress Notes (Addendum)
 Pt is here STD visit. Wet Prep results reviewed with ptand requires no treatment. Patient as well has been treated today for Being a contact to Gonorrhea. Ceftriaxone  500 mg IM injection given to patient at the [facility] and patient tolerated well to injection. Opportunity given to Patient to ask questions for any clarifications, questions answered. Condoms declined. Wilkie Drought, RN.

## 2023-12-28 NOTE — Addendum Note (Signed)
 Addended by: SUELLA BOSS on: 12/28/2023 04:28 PM   Modules accepted: Orders
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
## 2023-12-29 LAB — WET PREP FOR TRICH, YEAST, CLUE
Trichomonas Exam: NEGATIVE
Yeast Exam: NEGATIVE

## 2024-01-02 LAB — GONOCOCCUS CULTURE

## 2024-01-09 ENCOUNTER — Telehealth: Payer: Self-pay

## 2024-01-09 NOTE — Telephone Encounter (Signed)
 Call pt re + GC result from 12/28/23 vaginal specimen.Rec'd tx during 12/28/23 visit as contact to [facility]. TOC ~3 months.

## 2024-01-09 NOTE — Telephone Encounter (Signed)
 Phone call to pt at [phone]. Left message stating nurse is calling for Phyllis Edwards. Please call Shalayah Beagley at [phone].

## 2024-01-09 NOTE — Telephone Encounter (Signed)
 Phone call to pt at [phone]. Person answered phone and stated Phyllis Edwards was in bathroom.  RN stated she would call back later.

## 2024-01-10 NOTE — Telephone Encounter (Signed)
 Phone call to pt. Pt answered and confirmed identity. Counseled pt re + GC result and tx already received, needs TOC in ~ 3 months. Pt expressed understanding.

## 2024-01-12 ENCOUNTER — Telehealth: Payer: Self-pay

## 2024-01-12 NOTE — Telephone Encounter (Signed)
 LVM for patient to call back concerning lab works

## 2024-01-16 NOTE — Telephone Encounter (Signed)
 Call received in [facility] 01/16/24 at 1:44 PM  Voicemail Preview: My name is [place]. Return phone number [phone] I had a message to give you a call. You would give me a call back. Thank you.

## 2024-01-18 NOTE — Telephone Encounter (Signed)
 LVM for pt to call back.

## 2024-01-19 NOTE — Addendum Note (Signed)
 Addended by: ROSABEL PERKINS E on: 01/19/2024 02:11 PM   Modules accepted: Orders

## 2024-01-26 ENCOUNTER — Telehealth: Payer: Self-pay

## 2024-01-26 NOTE — Telephone Encounter (Signed)
 Left message for pt to call back to the clinic next week

## 2024-01-30 ENCOUNTER — Ambulatory Visit: Payer: MEDICAID

## 2024-01-30 DIAGNOSIS — Z113 Encounter for screening for infections with a predominantly sexual mode of transmission: Secondary | ICD-10-CM | POA: Diagnosis not present

## 2024-01-30 LAB — HEPATITIS B SURFACE ANTIGEN

## 2024-01-30 LAB — HM HEPATITIS C SCREENING LAB: HM Hepatitis Screen: NEGATIVE

## 2024-01-30 LAB — WET PREP FOR TRICH, YEAST, CLUE
Clue Cell Exam: NEGATIVE
Trichomonas Exam: NEGATIVE
Yeast Exam: NEGATIVE

## 2024-01-30 LAB — HM HIV SCREENING LAB: HM HIV Screening: NEGATIVE
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
## 2024-01-30 NOTE — Progress Notes (Addendum)
 Pt is here for STD screening. Wet prep results reviewed with patient and requires no treatment per SO. Opportunity given to Patient to ask questions for any clarifications, questions answered. Condoms Declined. Wilkie Drought, RN.

## 2024-03-13 ENCOUNTER — Emergency Department: Payer: MEDICAID

## 2024-03-13 ENCOUNTER — Encounter (HOSPITAL_COMMUNITY): Payer: Self-pay

## 2024-03-13 ENCOUNTER — Inpatient Hospital Stay (HOSPITAL_COMMUNITY)
Admission: AD | Admit: 2024-03-13 | Discharge: 2024-03-18 | DRG: 885 | Disposition: A | Discharge status: Home / Self Care | Payer: MEDICAID | Source: Intra-hospital

## 2024-03-13 ENCOUNTER — Other Ambulatory Visit: Payer: Self-pay

## 2024-03-13 ENCOUNTER — Emergency Department
Admission: EM | Admit: 2024-03-13 | Discharge: 2024-03-13 | Disposition: A | Discharge status: Other Acute Inpatient Hospital | Payer: MEDICAID

## 2024-03-13 DIAGNOSIS — F411 Generalized anxiety disorder: Secondary | ICD-10-CM | POA: Diagnosis present

## 2024-03-13 DIAGNOSIS — F332 Major depressive disorder, recurrent severe without psychotic features: Secondary | ICD-10-CM | POA: Diagnosis not present

## 2024-03-13 DIAGNOSIS — Z8659 Personal history of other mental and behavioral disorders: Secondary | ICD-10-CM | POA: Diagnosis not present

## 2024-03-13 DIAGNOSIS — M79671 Pain in right foot: Secondary | ICD-10-CM | POA: Insufficient documentation

## 2024-03-13 DIAGNOSIS — Z9049 Acquired absence of other specified parts of digestive tract: Secondary | ICD-10-CM

## 2024-03-13 DIAGNOSIS — R45850 Homicidal ideations: Secondary | ICD-10-CM | POA: Diagnosis not present

## 2024-03-13 DIAGNOSIS — Z5982 Transportation insecurity: Secondary | ICD-10-CM

## 2024-03-13 DIAGNOSIS — Z79899 Other long term (current) drug therapy: Secondary | ICD-10-CM

## 2024-03-13 DIAGNOSIS — Z808 Family history of malignant neoplasm of other organs or systems: Secondary | ICD-10-CM

## 2024-03-13 DIAGNOSIS — F17210 Nicotine dependence, cigarettes, uncomplicated: Secondary | ICD-10-CM | POA: Diagnosis present

## 2024-03-13 DIAGNOSIS — S80211A Abrasion, right knee, initial encounter: Secondary | ICD-10-CM | POA: Insufficient documentation

## 2024-03-13 DIAGNOSIS — S0003XA Contusion of scalp, initial encounter: Secondary | ICD-10-CM | POA: Diagnosis not present

## 2024-03-13 DIAGNOSIS — Z716 Tobacco abuse counseling: Secondary | ICD-10-CM

## 2024-03-13 DIAGNOSIS — G4700 Insomnia, unspecified: Secondary | ICD-10-CM | POA: Diagnosis present

## 2024-03-13 DIAGNOSIS — F1410 Cocaine abuse, uncomplicated: Secondary | ICD-10-CM | POA: Diagnosis present

## 2024-03-13 DIAGNOSIS — F4310 Post-traumatic stress disorder, unspecified: Secondary | ICD-10-CM | POA: Diagnosis present

## 2024-03-13 DIAGNOSIS — Z91138 Patient's unintentional underdosing of medication regimen for other reason: Secondary | ICD-10-CM

## 2024-03-13 DIAGNOSIS — Z1152 Encounter for screening for COVID-19: Secondary | ICD-10-CM

## 2024-03-13 DIAGNOSIS — R030 Elevated blood-pressure reading, without diagnosis of hypertension: Secondary | ICD-10-CM | POA: Diagnosis not present

## 2024-03-13 DIAGNOSIS — Z818 Family history of other mental and behavioral disorders: Secondary | ICD-10-CM

## 2024-03-13 DIAGNOSIS — Z5948 Other specified lack of adequate food: Secondary | ICD-10-CM

## 2024-03-13 DIAGNOSIS — Z90710 Acquired absence of both cervix and uterus: Secondary | ICD-10-CM

## 2024-03-13 DIAGNOSIS — M79604 Pain in right leg: Secondary | ICD-10-CM | POA: Diagnosis present

## 2024-03-13 DIAGNOSIS — J449 Chronic obstructive pulmonary disease, unspecified: Secondary | ICD-10-CM | POA: Diagnosis not present

## 2024-03-13 DIAGNOSIS — S0990XA Unspecified injury of head, initial encounter: Secondary | ICD-10-CM | POA: Diagnosis present

## 2024-03-13 DIAGNOSIS — J439 Emphysema, unspecified: Secondary | ICD-10-CM | POA: Diagnosis present

## 2024-03-13 DIAGNOSIS — T50916A Underdosing of multiple unspecified drugs, medicaments and biological substances, initial encounter: Secondary | ICD-10-CM | POA: Diagnosis present

## 2024-03-13 DIAGNOSIS — Z5941 Food insecurity: Secondary | ICD-10-CM

## 2024-03-13 DIAGNOSIS — Z5901 Sheltered homelessness: Secondary | ICD-10-CM

## 2024-03-13 DIAGNOSIS — B9789 Other viral agents as the cause of diseases classified elsewhere: Secondary | ICD-10-CM | POA: Diagnosis not present

## 2024-03-13 LAB — COMPREHENSIVE METABOLIC PANEL WITH GFR
ALT: 12 U/L (ref 0–44)
AST: 15 U/L (ref 15–41)
Albumin: 4.3 g/dL (ref 3.5–5.0)
Alkaline Phosphatase: 75 U/L (ref 38–126)
Anion gap: 9 (ref 5–15)
BUN: 11 mg/dL (ref 6–20)
CO2: 26 mmol/L (ref 22–32)
Calcium: 9.2 mg/dL (ref 8.9–10.3)
Chloride: 104 mmol/L (ref 98–111)
Creatinine, Ser: 0.64 mg/dL (ref 0.44–1.00)
GFR, Estimated: >60 mL/min (ref >60)
Glucose, Bld: 88 mg/dL (ref 70–99)
Potassium: 4.1 mmol/L (ref 3.5–5.1)
Sodium: 139 mmol/L (ref 135–145)
Total Bilirubin: 0.6 mg/dL (ref 0.0–1.2)
Total Protein: 7 g/dL (ref 6.5–8.1)

## 2024-03-13 LAB — URINE DRUG SCREEN
Amphetamines: NEGATIVE
Barbiturates: NEGATIVE
Benzodiazepines: NEGATIVE
Cocaine: POSITIVE — AB
Fentanyl: NEGATIVE
Methadone Scn, Ur: NEGATIVE
Opiates: NEGATIVE
Tetrahydrocannabinol: NEGATIVE

## 2024-03-13 LAB — TROPONIN T, HIGH SENSITIVITY: Troponin T High Sensitivity: <15 ng/L (ref 0–19)

## 2024-03-13 LAB — CBC WITH DIFFERENTIAL/PLATELET
Abs Immature Granulocytes: 0.03 K/uL (ref 0.00–0.07)
Basophils Absolute: 0.1 K/uL (ref 0.0–0.1)
Basophils Relative: 1 %
Eosinophils Absolute: 0.1 K/uL (ref 0.0–0.5)
Eosinophils Relative: 1 %
HCT: 38.6 % (ref 36.0–46.0)
Hemoglobin: 12.8 g/dL (ref 12.0–15.0)
Immature Granulocytes: 0 %
Lymphocytes Relative: 28 %
Lymphs Abs: 2.5 K/uL (ref 0.7–4.0)
MCH: 31.4 pg (ref 26.0–34.0)
MCHC: 33.2 g/dL (ref 30.0–36.0)
MCV: 94.8 fL (ref 80.0–100.0)
Monocytes Absolute: 0.9 K/uL (ref 0.1–1.0)
Monocytes Relative: 10 %
Neutro Abs: 5.4 K/uL (ref 1.7–7.7)
Neutrophils Relative %: 60 %
Platelets: 261 K/uL (ref 150–400)
RBC: 4.07 MIL/uL (ref 3.87–5.11)
RDW: 13 % (ref 11.5–15.5)
WBC: 8.9 K/uL (ref 4.0–10.5)
nRBC: 0 % (ref 0.0–0.2)

## 2024-03-13 LAB — ETHANOL: Alcohol, Ethyl (B): <15 mg/dL (ref <15)

## 2024-03-13 MED ORDER — ALBUTEROL SULFATE HFA 108 (90 BASE) MCG/ACT IN AERS: 1.0000 | INHALATION_SPRAY | RESPIRATORY_TRACT | Status: DC | PRN

## 2024-03-13 MED ORDER — LORAZEPAM 2 MG/ML IJ SOLN: 2.0000 mg | Freq: Three times a day (TID) | INTRAMUSCULAR | Status: DC | PRN

## 2024-03-13 MED ORDER — ACETAMINOPHEN 325 MG PO TABS: 650.0000 mg | ORAL_TABLET | ORAL | Status: DC | PRN

## 2024-03-13 MED ORDER — ENSURE PLUS HIGH PROTEIN PO LIQD
237.0000 mL | Freq: Two times a day (BID) | ORAL | Status: DC
  Administered 2024-03-14 – 2024-03-17 (×7): 237 mL via ORAL
  Filled 2024-03-13 (×11): qty 237

## 2024-03-13 MED ORDER — INFLUENZA VIRUS VACC SPLIT PF (FLUZONE) 0.5 ML IM SUSY
0.5000 mL | PREFILLED_SYRINGE | INTRAMUSCULAR | Status: DC
  Filled 2024-03-13: qty 0.5

## 2024-03-13 MED ORDER — NICOTINE 21 MG/24HR TD PT24
21.0000 mg | MEDICATED_PATCH | Freq: Every day | TRANSDERMAL | Status: DC
  Filled 2024-03-13 (×5): qty 1

## 2024-03-13 MED ORDER — IBUPROFEN 600 MG PO TABS: 600.0000 mg | ORAL_TABLET | Freq: Three times a day (TID) | ORAL | Status: DC | PRN

## 2024-03-13 MED ORDER — ALUM & MAG HYDROXIDE-SIMETH 200-200-20 MG/5ML PO SUSP: 30.0000 mL | ORAL | Status: DC | PRN

## 2024-03-13 MED ORDER — LORAZEPAM 1 MG PO TABS
1.0000 mg | ORAL_TABLET | Freq: Once | ORAL | Status: AC
  Administered 2024-03-13: 1 mg via ORAL
  Filled 2024-03-13: qty 1

## 2024-03-13 MED ORDER — HALOPERIDOL LACTATE 5 MG/ML IJ SOLN: 5.0000 mg | Freq: Three times a day (TID) | INTRAMUSCULAR | Status: DC | PRN

## 2024-03-13 MED ORDER — DIPHENHYDRAMINE HCL 50 MG/ML IJ SOLN: 50.0000 mg | Freq: Three times a day (TID) | INTRAMUSCULAR | Status: DC | PRN

## 2024-03-13 MED ORDER — ACETAMINOPHEN 325 MG PO TABS: 650.0000 mg | ORAL_TABLET | Freq: Four times a day (QID) | ORAL | Status: DC | PRN

## 2024-03-13 MED ORDER — PNEUMOCOCCAL 20-VAL CONJ VACC 0.5 ML IM SUSY
0.5000 mL | PREFILLED_SYRINGE | INTRAMUSCULAR | Status: DC
  Filled 2024-03-13: qty 0.5

## 2024-03-13 MED ORDER — CYCLOBENZAPRINE HCL 10 MG PO TABS: 10.0000 mg | ORAL_TABLET | Freq: Every day | ORAL | Status: DC

## 2024-03-13 MED ORDER — FLUOXETINE HCL 20 MG PO CAPS
20.0000 mg | ORAL_CAPSULE | Freq: Every day | ORAL | Status: DC
  Administered 2024-03-13 – 2024-03-18 (×6): 20 mg via ORAL
  Filled 2024-03-13 (×6): qty 1

## 2024-03-13 MED ORDER — DIPHENHYDRAMINE HCL 25 MG PO CAPS: 50.0000 mg | ORAL_CAPSULE | Freq: Three times a day (TID) | ORAL | Status: DC | PRN

## 2024-03-13 MED ORDER — HYDROXYZINE HCL 25 MG PO TABS
25.0000 mg | ORAL_TABLET | Freq: Three times a day (TID) | ORAL | Status: DC | PRN
  Administered 2024-03-14 – 2024-03-17 (×6): 25 mg via ORAL
  Filled 2024-03-13 (×6): qty 1

## 2024-03-13 MED ORDER — ALBUTEROL SULFATE (2.5 MG/3ML) 0.083% IN NEBU: 2.5000 mg | INHALATION_SOLUTION | Freq: Four times a day (QID) | RESPIRATORY_TRACT | Status: DC | PRN

## 2024-03-13 MED ORDER — MAGNESIUM HYDROXIDE 400 MG/5ML PO SUSP: 30.0000 mL | Freq: Every day | ORAL | Status: DC | PRN

## 2024-03-13 MED ORDER — HALOPERIDOL LACTATE 5 MG/ML IJ SOLN: 10.0000 mg | Freq: Three times a day (TID) | INTRAMUSCULAR | Status: DC | PRN

## 2024-03-13 MED ORDER — PRAZOSIN HCL 1 MG PO CAPS
1.0000 mg | ORAL_CAPSULE | Freq: Every day | ORAL | Status: DC
  Administered 2024-03-13 – 2024-03-14 (×2): 1 mg via ORAL
  Filled 2024-03-13 (×2): qty 1

## 2024-03-13 MED ORDER — ONDANSETRON HCL 4 MG PO TABS: 4.0000 mg | ORAL_TABLET | Freq: Three times a day (TID) | ORAL | Status: DC | PRN

## 2024-03-13 MED ORDER — NICOTINE 21 MG/24HR TD PT24
21.0000 mg | MEDICATED_PATCH | Freq: Every day | TRANSDERMAL | Status: DC
  Filled 2024-03-13: qty 1

## 2024-03-13 MED ORDER — HALOPERIDOL 5 MG PO TABS: 5.0000 mg | ORAL_TABLET | Freq: Three times a day (TID) | ORAL | Status: DC | PRN

## 2024-03-13 MED ORDER — FLUTICASONE FUROATE-VILANTEROL 200-25 MCG/ACT IN AEPB
1.0000 | INHALATION_SPRAY | Freq: Every day | RESPIRATORY_TRACT | Status: DC
  Administered 2024-03-13 – 2024-03-18 (×6): 1 via RESPIRATORY_TRACT
  Filled 2024-03-13: qty 28

## 2024-03-13 MED ORDER — TRAZODONE HCL 50 MG PO TABS
50.0000 mg | ORAL_TABLET | Freq: Every day | ORAL | Status: DC
  Administered 2024-03-13 – 2024-03-17 (×5): 50 mg via ORAL
  Filled 2024-03-13 (×5): qty 1

## 2024-03-13 NOTE — ED Notes (Signed)
 This tech assisted pt to ambulate to the restroom at this time.
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
## 2024-03-13 NOTE — Group Note (Signed)
 Date:  03/13/2024 Time:  4:53 PM  Group Topic/Focus: Sleep Hygiene Dimensions of Wellness:   The focus of this group is to introduce the topic of wellness and discuss the role each dimension of wellness plays in total health.    Participation Level:  Did Not Attend   Phyllis Edwards 03/13/2024, 4:53 PM

## 2024-03-13 NOTE — ED Notes (Signed)
 This RN and Caitlyn EDT, assisted pt with dress out.   Patient belongings: Phone Shoes Vape Chapstick Gray socks [employer] Gray pants [place]

## 2024-03-13 NOTE — ED Notes (Signed)
EMTALA reviewed. 
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
## 2024-03-13 NOTE — Tx Team (Signed)
 Initial Treatment Plan 03/13/2024 7:11 PM Phyllis Edwards FMW:[id number]    PATIENT STRESSORS: Financial difficulties   Health problems   Marital or family conflict   Medication change or noncompliance   Occupational concerns   Substance abuse   Traumatic event     PATIENT STRENGTHS: Ability for insight  Average or above average intelligence  Capable of independent living  Communication skills  General fund of knowledge  Motivation for treatment/growth  Physical Health    PATIENT IDENTIFIED PROBLEMS: substance abuse  SI  Anxiety  Depression               DISCHARGE CRITERIA:  Ability to meet basic life and health needs Adequate post-discharge living arrangements Improved stabilization in mood, thinking, and/or behavior Medical problems require only outpatient monitoring Motivation to continue treatment in a less acute level of care Need for constant or close observation no longer present Reduction of life-threatening or endangering symptoms to within safe limits Safe-care adequate arrangements made Verbal commitment to aftercare and medication compliance Withdrawal symptoms are absent or subacute and managed without 24-hour nursing intervention  PRELIMINARY DISCHARGE PLAN: Attend aftercare/continuing care group Attend PHP/IOP Attend 12-step recovery group Outpatient therapy Placement in alternative living arrangements  PATIENT/FAMILY INVOLVEMENT: This treatment plan has been presented to and reviewed with the patient, [employer]..  The patient and family have been given the opportunity to ask questions and make suggestions.  Anice Line Keachi, RN 03/13/2024, 7:11 PM
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
## 2024-03-13 NOTE — Plan of Care (Signed)
 Nurse discussed anxiety, depression and coping skills with patient.

## 2024-03-13 NOTE — [facility] Group Notes (Signed)
 Phyllis Edwards did not attend wrap up group

## 2024-03-13 NOTE — ED Provider Notes (Signed)
 Patient is medically clear for psychiatric disposition  The patient has been placed in psychiatric observation due to the need to provide a safe environment for the patient while obtaining psychiatric consultation and evaluation, as well as ongoing medical and medication management to treat the patient's condition.  The patient has been placed under full IVC at this time.    Nicholaus Rolland BRAVO, MD 03/13/24 1018

## 2024-03-13 NOTE — BH Assessment (Signed)
 Patient has been accepted to [facility] on today 03/13/24 pending UDS and EKG.  Patient assigned to room 402, bed# 2. Accepting physician is Dr. Raliegh.  Call report to [phone].  Representative was [employer].   ER Staff is aware of it:  Luann/Angela, ER Secretary  Dr. Nicholaus, ER MD  Christyne, Patient's Nurse
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
## 2024-03-14 DIAGNOSIS — F411 Generalized anxiety disorder: Secondary | ICD-10-CM | POA: Diagnosis present

## 2024-03-14 DIAGNOSIS — F332 Major depressive disorder, recurrent severe without psychotic features: Principal | ICD-10-CM | POA: Diagnosis present

## 2024-03-14 LAB — URINALYSIS, ROUTINE W REFLEX MICROSCOPIC
Bilirubin Urine: NEGATIVE
Glucose, UA: NEGATIVE mg/dL
Hgb urine dipstick: NEGATIVE
Ketones, ur: NEGATIVE mg/dL
Nitrite: POSITIVE — AB
Protein, ur: NEGATIVE mg/dL
Specific Gravity, Urine: 1.018 (ref 1.005–1.030)
WBC, UA: >50 WBC/hpf (ref 0–5)
pH: 5 (ref 5.0–8.0)

## 2024-03-14 LAB — PREGNANCY, URINE: Preg Test, Ur: NEGATIVE
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
## 2024-03-14 NOTE — Plan of Care (Signed)
 ?  Problem: Education: ?Goal: Mental status will improve ?Outcome: Progressing ?Goal: Verbalization of understanding the information provided will improve ?Outcome: Progressing ?  ?

## 2024-03-14 NOTE — Progress Notes (Signed)
   03/14/24 1500  Psych Admission Type (Psych Patients Only)  Admission Status Involuntary  Psychosocial Assessment  Patient Complaints Anxiety  Eye Contact Fair  Facial Expression Anxious  Affect Anxious;Sad;Depressed  Speech Logical/coherent  Interaction Assertive  Motor Activity Slow  Appearance/Hygiene Disheveled  Behavior Characteristics Cooperative  Mood Depressed;Anxious  Thought Process  Coherency WDL  Content WDL  Delusions None reported or observed  Perception WDL  Hallucination None reported or observed  Judgment Poor  Confusion None  Danger to Self  Current suicidal ideation? Denies  Danger to Others  Danger to Others None reported or observed  Danger to Others Abnormal  Harmful Behavior to others No threats or harm toward other people

## 2024-03-14 NOTE — Group Note (Signed)
 Date:  03/14/2024 Time:  3:44 PM  Group Topic/Focus: Emotional wellness  Emotional Education:   The focus of this group is to discuss what feelings/emotions are, and how they are experienced.    Participation Level:  Active  Participation Quality:  Appropriate  Affect:  Appropriate  Cognitive:  Alert and Appropriate  Insight: Good  Engagement in Group:  Engaged  Modes of Intervention:  Socialization  Additional Comments:    Phyllis Edwards 03/14/2024, 3:44 PM

## 2024-03-14 NOTE — Plan of Care (Signed)
   Problem: Education: Goal: Knowledge of [facility] Education information/materials will improve Outcome: Progressing Goal: Emotional status will improve Outcome: Progressing Goal: Mental status will improve Outcome: Progressing Goal: Verbalization of understanding the information provided will improve Outcome: Progressing

## 2024-03-14 NOTE — Group Note (Signed)
 Date:  03/14/2024 Time:  10:05 AM  Group Topic/Focus: physical wellness orientation goals group Dimensions of Wellness:   The focus of this group is to introduce the topic of wellness and discuss the role each dimension of wellness plays in total health. Orientation:   The focus of this group is to educate the patient on the purpose and policies of crisis stabilization and provide a format to answer questions about their admission.  The group details unit policies and expectations of patients while admitted. Wellness Toolbox:   The focus of this group is to discuss various aspects of wellness, balancing those aspects and exploring ways to increase the ability to experience wellness.  Patients will create a wellness toolbox for use upon discharge.    Participation Level:  Did Not Attend  Dolores CHRISTELLA Fredericks 03/14/2024, 10:05 AM

## 2024-03-14 NOTE — Group Note (Signed)
 LCSW Group Therapy Note   Group Date: 03/14/2024 Start Time: 1100 End Time: 1200   Participation:  patient was present and actively participated in the discussion.  Type of Therapy:  Group Therapy  Topic:  Lifestyle:  from "One Day" to "Today is Day One"  Objective:  To promote mental and physical well-being through lifestyle changes in routine, nutrition, sleep, and movement.  Goals: Increase awareness of how lifestyle habits impact mental health. Encourage one small, achievable wellness goal. Support group sharing and accountability.  Summary:  Group members explored how daily habits influence mental health and discussed the importance of starting with small, manageable changes. Participants identified personal goals and shared reflections on improving structure, sleep, diet, and physical activity.  Therapeutic Modalities: CBT - Identifying and challenging all-or-nothing thinking; promoting realistic, helpful thoughts about change. Psychoeducation - Teaching about the impact of sleep, nutrition, movement, and routine on mental health. Motivational Interviewing - Eliciting personal motivation and exploring readiness for change. Goal-Setting - Supporting SMART goals to build self-efficacy and encourage follow-through.   Phyllis Edwards O Phyllis Edwards, LCSWA 03/14/2024  12:37 PM

## 2024-03-14 NOTE — Group Note (Deleted)
 Date:  03/14/2024 Time:  4:18 PM  Group Topic/Focus:  Overcoming Stress:   The focus of this group is to define stress and help patients assess their triggers.    Participation Level:  {BHH PARTICIPATION OZCZO:77735}  Participation Quality:  {BHH PARTICIPATION QUALITY:22265}  Affect:  {BHH AFFECT:22266}  Cognitive:  {BHH COGNITIVE:22267}  Insight: {BHH Insight2:20797}  Engagement in Group:  {BHH ENGAGEMENT IN HMNLE:77731}  Modes of Intervention:  {BHH MODES OF INTERVENTION:22269}  Additional Comments:  ***  Inocente PARAS Quantasia Stegner 03/14/2024, 4:18 PM

## 2024-03-14 NOTE — Group Note (Signed)
 Date:  03/14/2024 Time:  4:13 PM  Group Topic/Focus:  Overcoming Stress:   The focus of this group is to define stress and help patients assess their triggers.    Participation Level:  Active  Participation Quality:  Appropriate  Affect:  Appropriate  Cognitive:  Alert  Insight: Appropriate  Engagement in Group:  Engaged  Modes of Intervention:  Socialization    Inocente PARAS Mkayla Steele 03/14/2024, 4:13 PM

## 2024-03-14 NOTE — BHH Suicide Risk Assessment (Signed)
 BHH INPATIENT:  Family/Significant Other Suicide Prevention Education  Suicide Prevention Education:  Education Completed; Theodoro Dawes [phone],  (name of family member/significant other) has been identified by the patient as the family member/significant other with whom the patient will be residing, and identified as the person(s) who will aid the patient in the event of a mental health crisis (suicidal ideations/suicide attempt).  With written consent from the patient, the family member/significant other has been provided the following suicide prevention education, prior to the and/or following the discharge of the patient.  Theodoro does not believe patient is a danger to herself or others.  He also indicates that pt does not have access to any weapons, dangerous objects, etc.  The suicide prevention education provided includes the following: Suicide risk factors Suicide prevention and interventions National Suicide Hotline telephone number [facility] assessment telephone number [facility] Emergency Assistance 911 [facility] and/or Residential Mobile Crisis Unit telephone number  Request made of family/significant other to: Remove weapons (e.g., guns, rifles, knives), all items previously/currently identified as safety concern.   Remove drugs/medications (over-the-counter, prescriptions, illicit drugs), all items previously/currently identified as a safety concern.  The family member/significant other verbalizes understanding of the suicide prevention education information provided.  The family member/significant other agrees to remove the items of safety concern listed above.  Derick JONELLE Blanch 03/14/2024, 1:13 PM

## 2024-03-14 NOTE — Progress Notes (Signed)
 Pt did not attend group, but did received a snack.
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
## 2024-03-14 NOTE — Group Note (Signed)
 Date:  03/14/2024 Time:  11:33 AM  Group Topic/Focus: Social work  Participation Level:  Active  Additional Comments:  Patient attend  Phyllis Edwards 03/14/2024, 11:33 AM
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
## 2024-03-14 NOTE — Progress Notes (Signed)
(  Sleep Hours) - 9.75 (Any PRNs that were needed, meds refused, or side effects to meds)- No PRN meds given, no meds refused.  (Any disturbances and when (visitation, over night)- None  (Concerns raised by the patient)- None  (SI/HI/AVH)- Denies SI/HI/AVH.

## 2024-03-15 ENCOUNTER — Encounter (HOSPITAL_COMMUNITY): Payer: Self-pay

## 2024-03-15 LAB — LIPID PANEL
Cholesterol: 139 mg/dL (ref 0–200)
HDL: 54 mg/dL (ref >40)
LDL Cholesterol: 74 mg/dL (ref 0–99)
Total CHOL/HDL Ratio: 2.6 ratio
Triglycerides: 56 mg/dL (ref <150)
VLDL: 11 mg/dL (ref 0–40)

## 2024-03-15 LAB — TSH: TSH: 1.15 u[IU]/mL (ref 0.350–4.500)

## 2024-03-15 MED ORDER — BUSPIRONE HCL 7.5 MG PO TABS
7.5000 mg | ORAL_TABLET | Freq: Two times a day (BID) | ORAL | Status: DC
  Administered 2024-03-15 – 2024-03-18 (×6): 7.5 mg via ORAL
  Filled 2024-03-15 (×6): qty 1

## 2024-03-15 MED ORDER — PRAZOSIN HCL 2 MG PO CAPS
2.0000 mg | ORAL_CAPSULE | Freq: Every day | ORAL | Status: DC
  Administered 2024-03-15 – 2024-03-17 (×3): 2 mg via ORAL
  Filled 2024-03-15 (×3): qty 1

## 2024-03-15 MED ORDER — IBUPROFEN 800 MG PO TABS
800.0000 mg | ORAL_TABLET | Freq: Three times a day (TID) | ORAL | Status: DC | PRN
  Administered 2024-03-15 – 2024-03-17 (×3): 800 mg via ORAL
  Filled 2024-03-15 (×3): qty 1

## 2024-03-15 MED ORDER — CLONIDINE HCL 0.1 MG PO TABS
0.1000 mg | ORAL_TABLET | Freq: Once | ORAL | Status: AC
Start: 2024-03-15 — End: 2024-03-15
  Administered 2024-03-15: 0.1 mg via ORAL
  Filled 2024-03-15: qty 1

## 2024-03-15 NOTE — Progress Notes (Signed)
   03/15/24 0900  Psych Admission Type (Psych Patients Only)  Admission Status Involuntary  Psychosocial Assessment  Patient Complaints Anxiety  Eye Contact Fair  Facial Expression Anxious  Affect Appropriate to circumstance  Speech Logical/coherent  Interaction Assertive  Motor Activity Slow  Appearance/Hygiene Disheveled  Behavior Characteristics Cooperative  Mood Depressed;Anxious  Thought Process  Coherency WDL  Content WDL  Delusions None reported or observed  Perception WDL  Hallucination None reported or observed  Judgment Poor  Confusion None  Danger to Self  Current suicidal ideation? Denies  Danger to Others  Danger to Others None reported or observed  Danger to Others Abnormal  Harmful Behavior to others No threats or harm toward other people  Destructive Behavior No threats or harm toward property

## 2024-03-15 NOTE — BHH Group Notes (Addendum)
 Adult Psychoeducational Group Note  Date:  03/14/2024 Time:  2000  Group Topic/Focus:  Wrap-Up Group:   The focus of this group is to help patients review their daily goal of treatment and discuss progress on daily workbooks.  Participation Level:  Did Not Attend  Participation Quality:    Affect:   Cognitive:    Insight:   Engagement in Group:    Modes of Intervention:    Additional Comments:    Phyllis Edwards

## 2024-03-15 NOTE — BH IP Treatment Plan (Signed)
 Interdisciplinary Treatment and Diagnostic Plan Update  03/15/2024 Time of Session: 10:35 AM Phyllis Edwards MRN: [id number]  Principal Diagnosis: Major depressive disorder, recurrent episode, severe (HCC)  Secondary Diagnoses: Principal Problem:   Major depressive disorder, recurrent episode, severe (HCC) Active Problems:   Homicidal ideation   GAD (generalized anxiety disorder)   Current Medications:  Current Facility-Administered Medications  Medication Dose Route Frequency Provider Last Rate Last Admin   acetaminophen  (TYLENOL ) tablet 650 mg  650 mg Oral Q6H PRN Smith, Annie B, NP       albuterol  (VENTOLIN  HFA) 108 (90 Base) MCG/ACT inhaler 1-2 puff  1-2 puff Inhalation Q4H PRN Smith, Annie B, NP       alum & mag hydroxide-simeth (MAALOX/MYLANTA) 200-200-20 MG/5ML suspension 30 mL  30 mL Oral Q4H PRN Smith, Annie B, NP       busPIRone (BUSPAR) tablet 7.5 mg  7.5 mg Oral BID Nwoko, Agnes I, NP   7.5 mg at 03/15/24 1637   haloperidol  (HALDOL ) tablet 5 mg  5 mg Oral TID PRN Smith, Annie B, NP       And   diphenhydrAMINE  (BENADRYL ) capsule 50 mg  50 mg Oral TID PRN Smith, Annie B, NP       haloperidol  lactate (HALDOL ) injection 5 mg  5 mg Intramuscular TID PRN Smith, Annie B, NP       And   diphenhydrAMINE  (BENADRYL ) injection 50 mg  50 mg Intramuscular TID PRN Smith, Annie B, NP       And   LORazepam  (ATIVAN ) injection 2 mg  2 mg Intramuscular TID PRN Smith, Annie B, NP       haloperidol  lactate (HALDOL ) injection 10 mg  10 mg Intramuscular TID PRN Smith, Annie B, NP       And   diphenhydrAMINE  (BENADRYL ) injection 50 mg  50 mg Intramuscular TID PRN Smith, Annie B, NP       And   LORazepam  (ATIVAN ) injection 2 mg  2 mg Intramuscular TID PRN Smith, Annie B, NP       feeding supplement (ENSURE PLUS HIGH PROTEIN) liquid 237 mL  237 mL Oral BID BM Bouchard, Marc A, DO   237 mL at 03/15/24 1637   FLUoxetine  (PROZAC ) capsule 20 mg  20 mg Oral Daily Smith, Annie B, NP   20 mg at 03/15/24  9170   fluticasone  furoate-vilanterol (BREO ELLIPTA ) 200-25 MCG/ACT 1 puff  1 puff Inhalation Daily Claudene Sham B, NP   1 puff at 03/15/24 9170   hydrOXYzine  (ATARAX ) tablet 25 mg  25 mg Oral TID PRN Smith, Annie B, NP   25 mg at 03/15/24 1125   ibuprofen  (ADVIL ) tablet 800 mg  800 mg Oral Q8H PRN Prentis Kitchens A, DO   800 mg at 03/15/24 1637   influenza vac split trivalent PF (FLUZONE ) injection 0.5 mL  0.5 mL Intramuscular Tomorrow-1000 Bouchard, Marc A, DO       magnesium  hydroxide (MILK OF MAGNESIA) suspension 30 mL  30 mL Oral Daily PRN Smith, Annie B, NP       nicotine  (NICODERM CQ  - dosed in mg/24 hours) patch 21 mg  21 mg Transdermal Daily Smith, Annie B, NP       pneumococcal 20-valent conjugate vaccine (PREVNAR 20 ) injection 0.5 mL  0.5 mL Intramuscular Tomorrow-1000 Bouchard, Marc A, DO       prazosin  (MINIPRESS ) capsule 2 mg  2 mg Oral QHS Nwoko, Agnes I, NP       traZODone  (DESYREL )  tablet 50 mg  50 mg Oral QHS Smith, Annie B, NP   50 mg at 03/14/24 2114   PTA Medications: Medications Prior to Admission  Medication Sig Dispense Refill Last Dose/Taking   albuterol  (PROVENTIL ) (2.5 MG/3ML) 0.083% nebulizer solution Take 3 mLs (2.5 mg total) by nebulization every 6 (six) hours as needed for wheezing or shortness of breath. 75 mL 0    albuterol  (PROVENTIL ) (2.5 MG/3ML) 0.083% nebulizer solution Take 3 mLs (2.5 mg total) by nebulization every 4 (four) hours as needed for wheezing or shortness of breath. 75 mL 2    albuterol  (VENTOLIN  HFA) 108 (90 Base) MCG/ACT inhaler Inhale 1-2 puffs into the lungs every 4 (four) hours as needed.      cyclobenzaprine  (FLEXERIL ) 10 MG tablet Take 1 tablet (10 mg total) by mouth at bedtime. (Patient not taking: Reported on 03/13/2024) 30 tablet 0    doxycycline  (VIBRAMYCIN ) 100 MG capsule Take 1 capsule (100 mg total) by mouth 2 (two) times daily. (Patient not taking: Reported on 03/13/2024) 14 capsule 0    FLUoxetine  (PROZAC ) 20 MG capsule Take 20 mg by  mouth daily.      ibuprofen  (ADVIL ,MOTRIN ) 600 MG tablet Take 1 tablet (600 mg total) by mouth every 8 (eight) hours as needed for mild pain or moderate pain. (Patient not taking: Reported on 03/13/2024) 15 tablet 0    metroNIDAZOLE  (FLAGYL ) 500 MG tablet Take 1 tablet (500 mg total) by mouth 2 (two) times daily. (Patient not taking: Reported on 03/13/2024) 14 tablet 0    ondansetron  (ZOFRAN -ODT) 4 MG disintegrating tablet Take 1 tablet (4 mg total) by mouth every 8 (eight) hours as needed for nausea or vomiting. (Patient not taking: Reported on 03/13/2024) 20 tablet 0    oxyCODONE  (ROXICODONE ) 15 MG immediate release tablet Take 1 tablet (15 mg total) by mouth every 8 (eight) hours as needed for pain. (Patient not taking: Reported on 03/13/2024) 8 tablet 0    prazosin  (MINIPRESS ) 1 MG capsule Take 1 mg by mouth at bedtime.      predniSONE  (DELTASONE ) 50 MG tablet Take 1 tablet (50 mg total) by mouth daily with breakfast. (Patient not taking: Reported on 03/13/2024) 5 tablet 0    SYMBICORT 160-4.5 MCG/ACT inhaler Inhale 2 puffs into the lungs in the morning and at bedtime.      traZODone  (DESYREL ) 100 MG tablet Take 100 mg by mouth at bedtime.       Patient Stressors: Financial difficulties   Health problems   Marital or family conflict   Medication change or noncompliance   Occupational concerns   Substance abuse   Traumatic event    Patient Strengths: Ability for insight  Average or above average intelligence  Capable of independent living  Communication skills  General fund of knowledge  Motivation for treatment/growth  Physical Health   Treatment Modalities: Medication Management, Group therapy, Case management,  1 to 1 session with clinician, Psychoeducation, Recreational therapy.   Physician Treatment Plan for Primary Diagnosis: Major depressive disorder, recurrent episode, severe (HCC) Long Term Goal(s): Improvement in symptoms so as ready for discharge   Short Term Goals:  Ability to identify and develop effective coping behaviors will improve Ability to maintain clinical measurements within normal limits will improve Compliance with prescribed medications will improve Ability to identify triggers associated with substance abuse/mental health issues will improve Ability to identify changes in lifestyle to reduce recurrence of condition will improve Ability to verbalize feelings will improve Ability to disclose and  discuss suicidal ideas Ability to demonstrate self-control will improve  Medication Management: Evaluate patient's response, side effects, and tolerance of medication regimen.  Therapeutic Interventions: 1 to 1 sessions, Unit Group sessions and Medication administration.  Evaluation of Outcomes: Not Progressing  Physician Treatment Plan for Secondary Diagnosis: Principal Problem:   Major depressive disorder, recurrent episode, severe (HCC) Active Problems:   Homicidal ideation   GAD (generalized anxiety disorder)  Long Term Goal(s): Improvement in symptoms so as ready for discharge   Short Term Goals: Ability to identify and develop effective coping behaviors will improve Ability to maintain clinical measurements within normal limits will improve Compliance with prescribed medications will improve Ability to identify triggers associated with substance abuse/mental health issues will improve Ability to identify changes in lifestyle to reduce recurrence of condition will improve Ability to verbalize feelings will improve Ability to disclose and discuss suicidal ideas Ability to demonstrate self-control will improve     Medication Management: Evaluate patient's response, side effects, and tolerance of medication regimen.  Therapeutic Interventions: 1 to 1 sessions, Unit Group sessions and Medication administration.  Evaluation of Outcomes: Not Progressing   RN Treatment Plan for Primary Diagnosis: Major depressive disorder, recurrent  episode, severe (HCC) Long Term Goal(s): Knowledge of disease and therapeutic regimen to maintain health will improve  Short Term Goals: Ability to remain free from injury will improve, Ability to verbalize frustration and anger appropriately will improve, Ability to demonstrate self-control, Ability to participate in decision making will improve, Ability to verbalize feelings will improve, Ability to disclose and discuss suicidal ideas, Ability to identify and develop effective coping behaviors will improve, and Compliance with prescribed medications will improve  Medication Management: RN will administer medications as ordered by provider, will assess and evaluate patient's response and provide education to patient for prescribed medication. RN will report any adverse and/or side effects to prescribing provider.  Therapeutic Interventions: 1 on 1 counseling sessions, Psychoeducation, Medication administration, Evaluate responses to treatment, Monitor vital signs and CBGs as ordered, Perform/monitor CIWA, COWS, AIMS and Fall Risk screenings as ordered, Perform wound care treatments as ordered.  Evaluation of Outcomes: Not Progressing   LCSW Treatment Plan for Primary Diagnosis: Major depressive disorder, recurrent episode, severe (HCC) Long Term Goal(s): Safe transition to appropriate next level of care at discharge, Engage patient in therapeutic group addressing interpersonal concerns.  Short Term Goals: Engage patient in aftercare planning with referrals and resources, Increase social support, Increase ability to appropriately verbalize feelings, Increase emotional regulation, Facilitate acceptance of mental health diagnosis and concerns, Facilitate patient progression through stages of change regarding substance use diagnoses and concerns, Identify triggers associated with mental health/substance abuse issues, and Increase skills for wellness and recovery  Therapeutic Interventions: Assess for  all discharge needs, 1 to 1 time with Social worker, Explore available resources and support systems, Assess for adequacy in community support network, Educate family and significant other(s) on suicide prevention, Complete Psychosocial Assessment, Interpersonal group therapy.  Evaluation of Outcomes: Not Progressing   Progress in Treatment: Attending groups: attended some groups Participating in groups: Yes. Taking medication as prescribed: Yes. Toleration medication: Yes. Family/Significant other contact made: No, will contact:  Theodoro Dawes [phone] Patient understands diagnosis: Yes. Discussing patient identified problems/goals with staff: Yes. Medical problems stabilized or resolved: Yes. Denies suicidal/homicidal ideation: Yes. Issues/concerns per patient self-inventory: No.  New problem(s) identified:  No  New Short Term/Long Term Goal(s):    medication stabilization, elimination of SI thoughts, development of comprehensive mental wellness plan.  Patient Goals:  I want to start and stay on my medications.   Discharge Plan or Barriers:  Patient recently admitted. CSW will continue to follow and assess for appropriate referrals and possible discharge planning.    Reason for Continuation of Hospitalization: Anxiety Depression Homicidal ideation Medication stabilization  Estimated Length of Stay:  5 - 7 days  Last 3 [place] Suicide Severity Risk Score: Flowsheet Row Ad[facility] (Current) [facility] Most recent reading at 03/13/2024  5:45 PM ED from 03/13/2024 in [facility] at [facility] Most recent reading at 03/13/2024  8:21 AM ED from 06/24/2022 in [facility] Emergency Department at [facility] Most recent reading at 06/24/2022  2:54 PM  C-SSRS RISK CATEGORY No Risk No Risk No Risk    Last PHQ 2/9 Scores:     No data to display          Scribe for Treatment  Team: Kj Imbert O Elliot Simoneaux, LCSWA 03/15/2024 7:23 PM

## 2024-03-15 NOTE — Group Note (Signed)
 Date:  03/15/2024 Time:  9:08 AM  Group Topic/Focus:  Goals Group:   The focus of this group is to help patients establish daily goals to achieve during treatment and discuss how the patient can incorporate goal setting into their daily lives to aide in recovery.    Participation Level:  Did Not Attend   Phyllis Edwards A Maleiah Dula 03/15/2024, 9:08 AM

## 2024-03-15 NOTE — Progress Notes (Signed)
(  Sleep Hours) -11  (Any PRNs that were needed, meds refused, or side effects to meds)- hydroxyzine  25mg   (Any disturbances and when (visitation, over night)-none  (Concerns raised by the patient)- none  (SI/HI/AVH)-denies all

## 2024-03-15 NOTE — Progress Notes (Signed)
 Shift Note  (Sleep Hours) - 13  (Any PRNs that were needed, meds refused, or side effects to meds)- PRN Trazodone   (Any disturbances and when (visitation, over night)- None  (Concerns raised by the patient)- None  (SI/HI/AVH)- Denies

## 2024-03-15 NOTE — Group Note (Signed)
 Recreation Therapy Group Note   Group Topic:Problem Solving  Group Date: 03/15/2024 Start Time: 0940 End Time: 1010 Facilitators: Alexis Mizuno-McCall, LRT,CTRS Location: [address] Dayroom   Group Topic: Problem Solving  Goal Area(s) Addresses:  Patient will effectively work in a team with other group members. Patient will verbalize importance of using appropriate problem solving techniques.  Patient will identify positive change associated with effective problem solving skills.   Behavioral Response:   Intervention: Worksheet  Activity: Dentist. Patients worked together to figure out mini puzzles that represent various things, sayings or places.    Education: Decision Making, Social Interaction, Discharge Planning  Education Outcome: Acknowledges understanding/In group clarification offered/Needs additional education.    Affect/Mood: N/A   Participation Level: Did not attend    Clinical Observations/Individualized Feedback:      Plan: Continue to engage patient in RT group sessions 2-3x/week.   Casten Floren-McCall, LRT,CTRS  03/15/2024 12:05 PM

## 2024-03-15 NOTE — Group Note (Signed)
 Date:  03/15/2024 Time:  10:35 PM  Group Topic/Focus:  Wrap-Up Group:   The focus of this group is to help patients review their daily goal of treatment and discuss progress on daily workbooks.    Participation Level:  Did Not Attend  Additional Comments:    Kristen VEAR Gibbon 03/15/2024, 10:35 PM
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
## 2024-03-15 NOTE — Plan of Care (Signed)
   Problem: Education: Goal: Emotional status will improve Outcome: Progressing Goal: Mental status will improve Outcome: Progressing Goal: Verbalization of understanding the information provided will improve Outcome: Progressing   Problem: Activity: Goal: Interest or engagement in activities will improve Outcome: Progressing

## 2024-03-15 NOTE — Group Note (Signed)
 Date:  03/15/2024 Time:  5:46 PM  Group Topic/Focus:  Dimensions of Wellness:   The focus of this group is to introduce the topic of wellness and discuss the role each dimension of wellness plays in total health.    Participation Level:  Active  Participation Quality:  Appropriate and Attentive  Affect:  Appropriate  Cognitive:  Alert  Insight: Appropriate  Engagement in Group:  Engaged  Modes of Intervention:  Discussion    Inocente PARAS Kyra Laffey 03/15/2024, 5:46 PM

## 2024-03-15 NOTE — Plan of Care (Signed)
   Problem: Education: Goal: Knowledge of [facility] Education information/materials will improve Outcome: Progressing Goal: Emotional status will improve Outcome: Progressing Goal: Mental status will improve Outcome: Progressing

## 2024-03-16 LAB — SARS CORONAVIRUS 2 BY RT PCR: SARS Coronavirus 2 by RT PCR: NEGATIVE

## 2024-03-16 LAB — HEMOGLOBIN A1C
Hgb A1c MFr Bld: 5.3 % (ref 4.8–5.6)
Mean Plasma Glucose: 105 mg/dL

## 2024-03-16 LAB — RESPIRATORY PANEL BY PCR

## 2024-03-16 MED ORDER — MENTHOL 3 MG MT LOZG: 1.0000 | LOZENGE | Freq: Three times a day (TID) | OROMUCOSAL | Status: DC | PRN

## 2024-03-16 NOTE — Group Note (Signed)
 Date:  03/16/2024 Time:  8:51 PM  Group Topic/Focus:  Wrap-Up Group:   The focus of this group is to help patients review their daily goal of treatment and discuss progress on daily workbooks.    Participation Level:  Did Not Attend  Participation Quality:  Resistant  Affect:  Resistant  Cognitive:  Lacking  Insight: None  Engagement in Group:  None  Modes of Intervention:  Discussion  Additional Comments:  Patient did not attend the wrap up group  Bari Moats 03/16/2024, 8:51 PM

## 2024-03-16 NOTE — Plan of Care (Signed)
   Problem: Education: Goal: Emotional status will improve Outcome: Progressing Goal: Mental status will improve Outcome: Progressing Goal: Verbalization of understanding the information provided will improve Outcome: Progressing   Problem: Activity: Goal: Interest or engagement in activities will improve Outcome: Progressing Goal: Sleeping patterns will improve Outcome: Progressing

## 2024-03-16 NOTE — Plan of Care (Signed)
   Problem: Education: Goal: Knowledge of [facility] Education information/materials will improve Outcome: Progressing Goal: Emotional status will improve Outcome: Progressing Goal: Mental status will improve Outcome: Progressing

## 2024-03-16 NOTE — Progress Notes (Signed)
   03/16/24 2000  Psych Admission Type (Psych Patients Only)  Admission Status Involuntary  Psychosocial Assessment  Patient Complaints None  Eye Contact Fair  Facial Expression Anxious  Affect Appropriate to circumstance  Speech Logical/coherent  Interaction Assertive  Motor Activity Slow  Appearance/Hygiene Disheveled  Behavior Characteristics Cooperative  Mood Pleasant  Thought Process  Coherency WDL  Content WDL  Delusions None reported or observed  Perception WDL  Hallucination None reported or observed  Judgment Poor  Confusion None  Danger to Self  Current suicidal ideation? Denies  Danger to Others  Danger to Others None reported or observed  Danger to Others Abnormal  Harmful Behavior to others No threats or harm toward other people  Destructive Behavior No threats or harm toward property

## 2024-03-16 NOTE — Progress Notes (Signed)
(  Sleep Hours) - 11.75  (Any PRNs that were needed, meds refused, or side effects to meds)- none  (Any disturbances and when (visitation, over night)-none  (Concerns raised by the patient)- none  (SI/HI/AVH)- denies

## 2024-03-16 NOTE — Group Note (Signed)
 Date:  03/16/2024 Time:  4:45 PM  Group Topic/Focus:   Dopamine: Effect on Mind and Body    Participation Level:  Active  Participation Quality:  Attentive  Affect:  Appropriate  Cognitive:  Alert  Insight: Appropriate  Engagement in Group:  Engaged  Modes of Intervention:  Discussion    Inocente PARAS Karina Nofsinger 03/16/2024, 4:45 PM

## 2024-03-16 NOTE — Group Note (Signed)
 Date:  03/16/2024 Time:  9:29 AM  Group Topic/Focus:  Goals Group:   The focus of this group is to help patients establish daily goals to achieve during treatment and discuss how the patient can incorporate goal setting into their daily lives to aide in recovery.    Participation Level:  Did Not Attend   [name] A [name] 03/16/2024, 9:29 AM

## 2024-03-16 NOTE — Progress Notes (Signed)
 Patient's BP noted to be 133/93. All other vital signs WNL. Asymptomatic, with no complaints. NAD noted. Will pass information on to day RN.

## 2024-03-16 NOTE — Group Note (Signed)
 LCSW Group Therapy Note  Group Date: 03/16/2024 Start Time: 0100 End Time: 0451   Type of Therapy and Topic:  Group Therapy: Positive Affirmations  Participation Level:  Did Not Attend   Description of Group:   This group addressed positive affirmation towards self and others.  Patients went around the room and identified two positive things about themselves and two positive things about a peer in the room.  Patients reflected on how it felt to share something positive with others, to identify positive things about themselves, and to hear positive things from others/ Patients were encouraged to have a daily reflection of positive characteristics or circumstances.   Therapeutic Goals: Patients will verbalize two of their positive qualities Patients will demonstrate empathy for others by stating two positive qualities about a peer in the group Patients will verbalize their feelings when voicing positive self affirmations and when voicing positive affirmations of others Patients will discuss the potential positive impact on their wellness/recovery of focusing on positive traits of self and others.  Summary of Patient Progress:  NA  Therapeutic Modalities:   Cognitive Behavioral Therapy Motivational Interviewing    Phyllis Edwards, LCSWA 03/16/2024  5:06 PM

## 2024-03-16 NOTE — Group Note (Signed)
 Date:  03/16/2024 Time:  10:22 AM  Group Topic/Focus:  Dimensions of Wellness:   The focus of this group is to introduce the topic of wellness and discuss the role each dimension of wellness plays in total health.    Participation Level:  Did Not Attend   Daniell Mancinas A Qamar Rosman 03/16/2024, 10:22 AM
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
## 2024-03-16 NOTE — Progress Notes (Signed)
   03/16/24 1300  Psych Admission Type (Psych Patients Only)  Admission Status Involuntary  Psychosocial Assessment  Patient Complaints Other (Comment) (Feeling congested)  Eye Contact Fair  Facial Expression Anxious  Affect Appropriate to circumstance  Speech Logical/coherent  Interaction Assertive  Motor Activity Slow  Appearance/Hygiene Disheveled  Behavior Characteristics Cooperative  Mood Pleasant  Thought Process  Coherency WDL  Content WDL  Delusions None reported or observed  Perception WDL  Hallucination None reported or observed  Judgment Poor  Confusion None  Danger to Self  Current suicidal ideation? Denies  Danger to Others  Danger to Others None reported or observed  Danger to Others Abnormal  Harmful Behavior to others No threats or harm toward other people  Destructive Behavior No threats or harm toward property

## 2024-03-16 NOTE — Progress Notes (Signed)
 Covid and flu swab completed and sent out. Pt is is wearing mask around others until results return.

## 2024-03-17 ENCOUNTER — Telehealth: Payer: Self-pay | Admitting: Infectious Diseases

## 2024-03-17 MED ORDER — OXYMETAZOLINE HCL 0.05 % NA SOLN
1.0000 | Freq: Two times a day (BID) | NASAL | Status: DC
  Administered 2024-03-17 – 2024-03-18 (×2): 1 via NASAL
  Filled 2024-03-17: qty 30

## 2024-03-17 MED ORDER — INFLUENZA VIRUS VACC SPLIT PF (FLUZONE) 0.5 ML IM SUSY
0.5000 mL | PREFILLED_SYRINGE | INTRAMUSCULAR | Status: DC
  Filled 2024-03-17: qty 0.5

## 2024-03-17 MED ORDER — NICOTINE POLACRILEX 2 MG MT GUM
2.0000 mg | CHEWING_GUM | OROMUCOSAL | Status: DC | PRN
  Administered 2024-03-17: 2 mg via ORAL

## 2024-03-17 NOTE — Group Note (Signed)
 Date:  03/17/2024 Time:  9:59 AM  Group Topic/Focus:  Goals Group:   The focus of this group is to help patients establish daily goals to achieve during treatment and discuss how the patient can incorporate goal setting into their daily lives to aide in recovery.    Participation Level:  Did Not Attend  Additional Comments:  Pt was aware that group was beginning but chore not to attend  [name] 03/17/2024, 9:59 AM

## 2024-03-17 NOTE — Plan of Care (Signed)
   Problem: Coping: Goal: Ability to verbalize frustrations and anger appropriately will improve Outcome: Progressing Goal: Ability to demonstrate self-control will improve Outcome: Progressing

## 2024-03-17 NOTE — Group Note (Signed)
 Date:  03/17/2024 Time:  10:06 AM  Group Topic/Focus:  Emotional Education:   The focus of this group is to discuss what feelings/emotions are, and how they are experienced.    Participation Level:  Did Not Attend   Additional Comments:  Pt was aware that group was beginning but chore not to attend  [name] 03/17/2024, 10:06 AM

## 2024-03-17 NOTE — Progress Notes (Signed)
 Contacted and spoke with Dr. Epifanio with ID for curbside consult on precaution recommendations since patient tested positive for rhinovirus. Per ID attending, recommendation is for patient to have droplet/contact precautions and to be isolated.  The patient's symptomatic roommate is to also have same precautions and be isolated to another room.  Brenten Janney Carrin Carrero, MD PGY-3, [facility]

## 2024-03-17 NOTE — Group Note (Signed)
 Date:  03/17/2024 Time:  8:54 PM  Group Topic/Focus:  Crisis Planning:   The purpose of this group is to help patients create a crisis plan for use upon discharge or in the future, as needed. Self Care:   The focus of this group is to help patients understand the importance of self-care in order to improve or restore emotional, physical, spiritual, interpersonal, and financial health.    Pt did not attend group.  Kaydyn Sayas L 03/17/2024, 8:54 PM

## 2024-03-17 NOTE — Progress Notes (Signed)
   03/17/24 1000  Psych Admission Type (Psych Patients Only)  Admission Status Involuntary  Psychosocial Assessment  Patient Complaints Malaise  Eye Contact Fair  Facial Expression Animated  Affect Appropriate to circumstance  Speech Logical/coherent  Interaction Assertive  Motor Activity Slow  Appearance/Hygiene Disheveled  Behavior Characteristics Cooperative  Mood Pleasant  Aggressive Behavior  Effect No apparent injury  Thought Process  Coherency WDL  Content WDL  Delusions None reported or observed  Perception WDL  Hallucination None reported or observed  Judgment Poor  Confusion None  Danger to Self  Current suicidal ideation? Denies  Danger to Others  Danger to Others None reported or observed  Danger to Others Abnormal  Harmful Behavior to others No threats or harm toward other people  Destructive Behavior No threats or harm toward property

## 2024-03-17 NOTE — [facility] Group Notes (Signed)
 [facility] Assessment Progress Note          Pt did not attend group

## 2024-03-17 NOTE — Group Note (Signed)
 Date:  03/17/2024 Time:  7:28 PM  Group Topic/Focus: 5 love languages Healthy Communication:   The focus of this group is to discuss communication, barriers to communication, as well as healthy ways to communicate with others.    Participation Level:  Did Not Attend   Juliene CHRISTELLA Huddle 03/17/2024, 7:28 PM

## 2024-03-17 NOTE — Telephone Encounter (Signed)
 Montie, just wanted to make sure Infection prevention can look at this tomorrow so there is not a rhinovirus outbreak at t the [facility] hospital. Psych called re the patient with URI sxs and + rhinovirus. Roommate also sick but tested neg.  Advised contact and droplet for both patients
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
## 2024-03-18 DIAGNOSIS — F1410 Cocaine abuse, uncomplicated: Secondary | ICD-10-CM | POA: Insufficient documentation

## 2024-03-18 MED ORDER — BUSPIRONE HCL 7.5 MG PO TABS: 7.5000 mg | ORAL_TABLET | Freq: Two times a day (BID) | ORAL | 0 refills | Status: AC

## 2024-03-18 MED ORDER — FLUOXETINE HCL 20 MG PO CAPS: 20.0000 mg | ORAL_CAPSULE | Freq: Every day | ORAL | 0 refills | Status: AC

## 2024-03-18 MED ORDER — OXYMETAZOLINE HCL 0.05 % NA SOLN: 1.0000 | Freq: Two times a day (BID) | NASAL | Status: AC

## 2024-03-18 MED ORDER — NICOTINE 21 MG/24HR TD PT24: 21.0000 mg | MEDICATED_PATCH | Freq: Every day | TRANSDERMAL | 0 refills | Status: AC

## 2024-03-18 MED ORDER — PRAZOSIN HCL 2 MG PO CAPS: 2.0000 mg | ORAL_CAPSULE | Freq: Every day | ORAL | 0 refills | Status: AC

## 2024-03-18 MED ORDER — TRAZODONE HCL 50 MG PO TABS: 50.0000 mg | ORAL_TABLET | Freq: Every day | ORAL | 0 refills | Status: AC

## 2024-03-18 NOTE — Progress Notes (Signed)
(  Sleep Hours) - 12.75 (Any PRNs that were needed, meds refused, or side effects to meds)- none (Any disturbances and when (visitation, over night)- n/a (Concerns raised by the patient)- none (SI/HI/AVH)- denies

## 2024-03-18 NOTE — [facility] Suicide Risk Assessment (Signed)
 [facility] INPATIENT:  Family/Significant Other Suicide Prevention Education  Suicide Prevention Education:  Education Completed; Chyrl (friend) [phone],  (name of family member/significant other) has been identified by the patient as the family member/significant other with whom the patient will be residing, and identified as the person(s) who will aid the patient in the event of a mental health crisis (suicidal ideations/suicide attempt).  With written consent from the patient, the family member/significant other has been provided the following suicide prevention education, prior to the and/or following the discharge of the patient.  No safety concerns with pt discharging to his house [address] in [place]. No weapons or firearms  The suicide prevention education provided includes the following: Suicide risk factors Suicide prevention and interventions National Suicide Hotline telephone number [facility] assessment telephone number [facility] Emergency Assistance 911 [facility] and/or Residential Mobile Crisis Unit telephone number  Request made of family/significant other to: Remove weapons (e.g., guns, rifles, knives), all items previously/currently identified as safety concern.   Remove drugs/medications (over-the-counter, prescriptions, illicit drugs), all items previously/currently identified as a safety concern.  The family member/significant other verbalizes understanding of the suicide prevention education information provided.  The family member/significant other agrees to remove the items of safety concern listed above.  Jenkins LULLA Primer 03/18/2024, 10:49 AM
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
## 2024-03-18 NOTE — Transportation (Signed)
 03/18/2024  Jalan Binette DOB: 1971-02-18 MRN: [id number]   RIDER WAIVER AND RELEASE OF LIABILITY  For the purposes of helping with transportation needs, [facility] partners with outside transportation providers (taxi companies, [employer], catering manager.) to give [facility] patients or other approved people the choice of on-demand rides Public Librarian) to our buildings for non-emergency visits.  By using [employer], I, the person signing this document, on behalf of myself and/or any legal minors (in my care using the [employer]), agree:  Science Writer given to me are supplied by independent, outside transportation providers who do not work for, or have any affiliation with, [employer]. [employer] is not a transportation company. [facility] has no control over the quality or safety of the rides I get using [employer]. [facility] has no control over whether any outside ride will happen on time or not. [facility] gives no guarantee on the reliability, quality, safety, or availability on any rides, or that no mistakes will happen. I know and accept that traveling by vehicle (car, truck, SVU, fleeta, bus, taxi, etc.) has risks of serious injuries such as disability, being paralyzed, and death. I know and agree the risk of using [employer] is mine alone, and not [employer]. [employer] are provided as is and as are available. The transportation providers are in charge for all inspections and care of the vehicles used to provide these rides. I agree not to take legal action against [facility], its agents, employees, officers, directors, representatives, insurers, attorneys, assigns, successors, subsidiaries, and affiliates at any time for any reasons related directly or indirectly to using [employer]. I also agree not to take legal action against [facility] or its affiliates for any injury, death, or damage to property caused by or related to using  [employer]. I have read this Waiver and Release of Liability, and I understand the terms used in it and their legal meaning. This Waiver is freely and voluntarily given with the understanding that my right (or any legal minors) to legal action against  relating to [employer] is knowingly given up to use these services.   I attest that I read the Ride Waiver and Release of Liability to Camelia Phenes, gave Ms. Woolf the opportunity to ask questions and answered the questions asked (if any). I affirm that [employer] then provided consent for assistance with transportation.

## 2024-03-18 NOTE — Discharge Instructions (Addendum)
-  Follow-up with your outpatient psychiatric provider -instructions on appointment date, time, and address (location) are provided to you in discharge paperwork.  -Take your psychiatric medications as prescribed at discharge - instructions are provided to you in the discharge paperwork  -Recommend abstinence from alcohol, tobacco, and other illicit drug use at discharge.   -If your psychiatric symptoms recur, worsen, or if you have side effects to your psychiatric medications, call your outpatient psychiatric provider, 911, 988 or go to the nearest emergency department.  -If suicidal thoughts recur, call your outpatient psychiatric provider, 911, 988 or go to the nearest emergency department.  Odis Cleveland, MD

## 2024-03-18 NOTE — Progress Notes (Signed)
 Pt discharged to lobby. Pt was stable and appreciative at that time. All papers and prescriptions were given and valuables returned. Verbal understanding expressed. Denies SI/HI and A/VH. Pt given opportunity to express concerns and ask questions.
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
## 2024-03-18 NOTE — Progress Notes (Signed)
  [facility] Adult Case Management Discharge Plan :  Will you be returning to the same living situation after discharge:  No. Pt is going to stay with her friend, Chyrl [phone]) At discharge, do you have transportation home?: Yes,  Pt will be transferred to her home via [facility] taxi. Do you have the ability to pay for your medications: Yes,  Pt has medications.  Release of information consent forms completed and in the chart;  Patient's signature needed at discharge.  Patient to Follow up at:  Follow-up Information     [facility] Follow up on 03/20/2024.   Why: You may also call this provider to schedule a hospital appointment for medication management and therapy services. Following this appointment, you will be scheduled for a clinical assessment, to obtain appointments for therapy and medication management services. Please bring a photo ID and your social security number, and insurance card. Contact information: [address] [place] [address] [phone]         [facility] Follow up on 03/20/2024.   Why: Please call this provider on 03/20/24 at 9:00 am to schedule a hospital appointment for medication management and therapy services. Contact information: [address], [place], [place] [address] Phone: [phone]                Next level of care provider has access to [facility] Link:no  Safety Planning and Suicide Prevention discussed: Yes,  Completed with Chyrl; no concerns re pt's safety.   Has patient been referred to the Quitline?: Patient refused referral for treatment  Patient has been referred for addiction treatment: Patient refused referral for treatment.  Derick JONELLE Blanch, LCSW 03/18/2024, 11:18 AM

## 2024-05-03 ENCOUNTER — Telehealth: Payer: Self-pay

## 2024-05-03 NOTE — Telephone Encounter (Signed)
#  3 attempt to call call pt  but no response. Emergency contact was unsuccessful
